# Patient Record
Sex: Male | Born: 1955 | Race: White | Hispanic: No | Marital: Married | Smoking: Current every day smoker
Health system: Southern US, Community
[De-identification: ages and names within clinical notes are randomized; demographics above are authoritative.]

## PROBLEM LIST (undated history)

## (undated) DIAGNOSIS — R51 Headache: Secondary | ICD-10-CM

## (undated) DIAGNOSIS — G8929 Other chronic pain: Secondary | ICD-10-CM

## (undated) DIAGNOSIS — G4730 Sleep apnea, unspecified: Secondary | ICD-10-CM

## (undated) DIAGNOSIS — R200 Anesthesia of skin: Secondary | ICD-10-CM

## (undated) DIAGNOSIS — F329 Major depressive disorder, single episode, unspecified: Secondary | ICD-10-CM

## (undated) DIAGNOSIS — M1990 Unspecified osteoarthritis, unspecified site: Secondary | ICD-10-CM

## (undated) DIAGNOSIS — R202 Paresthesia of skin: Secondary | ICD-10-CM

## (undated) DIAGNOSIS — Z8669 Personal history of other diseases of the nervous system and sense organs: Secondary | ICD-10-CM

## (undated) DIAGNOSIS — K219 Gastro-esophageal reflux disease without esophagitis: Secondary | ICD-10-CM

## (undated) DIAGNOSIS — R42 Dizziness and giddiness: Secondary | ICD-10-CM

## (undated) DIAGNOSIS — F32A Depression, unspecified: Secondary | ICD-10-CM

## (undated) DIAGNOSIS — R519 Headache, unspecified: Secondary | ICD-10-CM

## (undated) DIAGNOSIS — Z8709 Personal history of other diseases of the respiratory system: Secondary | ICD-10-CM

## (undated) DIAGNOSIS — M549 Dorsalgia, unspecified: Secondary | ICD-10-CM

## (undated) DIAGNOSIS — I10 Essential (primary) hypertension: Secondary | ICD-10-CM

## (undated) DIAGNOSIS — D649 Anemia, unspecified: Secondary | ICD-10-CM

## (undated) HISTORY — PX: TONSILLECTOMY: SUR1361

## (undated) HISTORY — PX: COLONOSCOPY: SHX174

## (undated) HISTORY — PX: HAMMER TOE SURGERY: SHX385

## (undated) HISTORY — PX: WRIST ARTHROPLASTY: SHX1088

## (undated) HISTORY — PX: OTHER SURGICAL HISTORY: SHX169

## (undated) HISTORY — PX: JOINT REPLACEMENT: SHX530

## (undated) HISTORY — PX: PAIN PUMP IMPLANTATION: SHX330

## (undated) HISTORY — PX: KNEE ARTHROSCOPY: SUR90

## (undated) HISTORY — PX: CHOLECYSTECTOMY: SHX55

## (undated) HISTORY — PX: UVULOPALATOPHARYNGOPLASTY: SHX827

## (undated) HISTORY — PX: APPENDECTOMY: SHX54

## (undated) HISTORY — PX: MUSCLE FLAP CLOSURE: SHX2054

---

## 1998-05-16 ENCOUNTER — Ambulatory Visit (HOSPITAL_BASED_OUTPATIENT_CLINIC_OR_DEPARTMENT_OTHER): Admission: RE | Admit: 1998-05-16 | Discharge: 1998-05-16 | Payer: Self-pay | Admitting: Orthopedic Surgery

## 1998-11-28 ENCOUNTER — Ambulatory Visit (HOSPITAL_BASED_OUTPATIENT_CLINIC_OR_DEPARTMENT_OTHER): Admission: RE | Admit: 1998-11-28 | Discharge: 1998-11-28 | Payer: Self-pay | Admitting: Orthopedic Surgery

## 1999-09-04 ENCOUNTER — Ambulatory Visit (HOSPITAL_BASED_OUTPATIENT_CLINIC_OR_DEPARTMENT_OTHER): Admission: RE | Admit: 1999-09-04 | Discharge: 1999-09-04 | Payer: Self-pay | Admitting: Orthopedic Surgery

## 2000-01-02 ENCOUNTER — Encounter: Payer: Self-pay | Admitting: Orthopedic Surgery

## 2000-01-08 ENCOUNTER — Inpatient Hospital Stay (HOSPITAL_COMMUNITY): Admission: RE | Admit: 2000-01-08 | Discharge: 2000-01-11 | Payer: Self-pay | Admitting: Orthopedic Surgery

## 2000-05-14 ENCOUNTER — Encounter: Admission: RE | Admit: 2000-05-14 | Discharge: 2000-08-12 | Payer: Self-pay | Admitting: Anesthesiology

## 2001-04-01 ENCOUNTER — Encounter: Payer: Self-pay | Admitting: Orthopedic Surgery

## 2001-04-05 ENCOUNTER — Inpatient Hospital Stay (HOSPITAL_COMMUNITY): Admission: RE | Admit: 2001-04-05 | Discharge: 2001-04-07 | Payer: Self-pay | Admitting: Orthopedic Surgery

## 2002-04-18 ENCOUNTER — Ambulatory Visit (HOSPITAL_BASED_OUTPATIENT_CLINIC_OR_DEPARTMENT_OTHER): Admission: RE | Admit: 2002-04-18 | Discharge: 2002-04-18 | Payer: Self-pay | Admitting: Orthopedic Surgery

## 2003-01-10 ENCOUNTER — Encounter: Payer: Self-pay | Admitting: Neurosurgery

## 2003-01-10 ENCOUNTER — Ambulatory Visit (HOSPITAL_COMMUNITY): Admission: RE | Admit: 2003-01-10 | Discharge: 2003-01-11 | Payer: Self-pay | Admitting: Neurosurgery

## 2005-05-06 ENCOUNTER — Ambulatory Visit (HOSPITAL_COMMUNITY): Admission: RE | Admit: 2005-05-06 | Discharge: 2005-05-07 | Payer: Self-pay | Admitting: Neurosurgery

## 2005-11-11 ENCOUNTER — Ambulatory Visit (HOSPITAL_COMMUNITY): Admission: RE | Admit: 2005-11-11 | Discharge: 2005-11-12 | Payer: Self-pay | Admitting: Orthopedic Surgery

## 2006-05-26 ENCOUNTER — Inpatient Hospital Stay (HOSPITAL_COMMUNITY): Admission: RE | Admit: 2006-05-26 | Discharge: 2006-05-28 | Payer: Self-pay | Admitting: Orthopedic Surgery

## 2006-11-13 ENCOUNTER — Ambulatory Visit (HOSPITAL_COMMUNITY): Admission: RE | Admit: 2006-11-13 | Discharge: 2006-11-13 | Payer: Self-pay | Admitting: Orthopedic Surgery

## 2006-11-13 ENCOUNTER — Ambulatory Visit: Payer: Self-pay | Admitting: Vascular Surgery

## 2006-11-19 ENCOUNTER — Ambulatory Visit: Payer: Self-pay | Admitting: Internal Medicine

## 2006-11-19 DIAGNOSIS — L03119 Cellulitis of unspecified part of limb: Secondary | ICD-10-CM

## 2006-11-19 DIAGNOSIS — L02419 Cutaneous abscess of limb, unspecified: Secondary | ICD-10-CM | POA: Insufficient documentation

## 2006-11-19 DIAGNOSIS — Z96659 Presence of unspecified artificial knee joint: Secondary | ICD-10-CM

## 2006-11-19 DIAGNOSIS — I10 Essential (primary) hypertension: Secondary | ICD-10-CM | POA: Insufficient documentation

## 2006-11-19 DIAGNOSIS — M1990 Unspecified osteoarthritis, unspecified site: Secondary | ICD-10-CM | POA: Insufficient documentation

## 2006-11-23 ENCOUNTER — Other Ambulatory Visit: Payer: Self-pay | Admitting: Orthopedic Surgery

## 2006-11-24 ENCOUNTER — Other Ambulatory Visit: Payer: Self-pay | Admitting: Orthopedic Surgery

## 2006-11-25 ENCOUNTER — Telehealth: Payer: Self-pay | Admitting: Internal Medicine

## 2006-11-26 ENCOUNTER — Ambulatory Visit: Payer: Self-pay | Admitting: Infectious Diseases

## 2006-11-26 ENCOUNTER — Encounter (INDEPENDENT_AMBULATORY_CARE_PROVIDER_SITE_OTHER): Payer: Self-pay | Admitting: Orthopedic Surgery

## 2006-11-26 ENCOUNTER — Ambulatory Visit (HOSPITAL_COMMUNITY): Admission: RE | Admit: 2006-11-26 | Discharge: 2006-11-27 | Payer: Self-pay | Admitting: Orthopedic Surgery

## 2007-01-28 ENCOUNTER — Ambulatory Visit (HOSPITAL_COMMUNITY): Admission: RE | Admit: 2007-01-28 | Discharge: 2007-01-29 | Payer: Self-pay | Admitting: Orthopedic Surgery

## 2007-02-09 ENCOUNTER — Inpatient Hospital Stay (HOSPITAL_COMMUNITY): Admission: AD | Admit: 2007-02-09 | Discharge: 2007-02-11 | Payer: Self-pay | Admitting: Orthopedic Surgery

## 2007-09-07 ENCOUNTER — Ambulatory Visit (HOSPITAL_COMMUNITY): Admission: RE | Admit: 2007-09-07 | Discharge: 2007-09-07 | Payer: Self-pay | Admitting: Orthopedic Surgery

## 2008-02-21 ENCOUNTER — Encounter: Payer: Self-pay | Admitting: Infectious Disease

## 2008-05-22 ENCOUNTER — Ambulatory Visit: Payer: Self-pay | Admitting: Infectious Disease

## 2008-05-22 DIAGNOSIS — M86669 Other chronic osteomyelitis, unspecified tibia and fibula: Secondary | ICD-10-CM | POA: Insufficient documentation

## 2008-05-22 LAB — CONVERTED CEMR LAB
AST: 26 units/L (ref 0–37)
Albumin: 4 g/dL (ref 3.5–5.2)
Alkaline Phosphatase: 77 units/L (ref 39–117)
CRP: 1.3 mg/dL — ABNORMAL HIGH (ref <0.6)
Calcium: 8.9 mg/dL (ref 8.4–10.5)
Chloride: 102 meq/L (ref 96–112)
Creatinine, Ser: 0.68 mg/dL (ref 0.40–1.50)
Hemoglobin: 13.2 g/dL (ref 13.0–17.0)
Lymphocytes Relative: 30 % (ref 12–46)
Lymphs Abs: 2.2 10*3/uL (ref 0.7–4.0)
MCV: 85 fL (ref 78.0–100.0)
Monocytes Relative: 7 % (ref 3–12)
Neutrophils Relative %: 59 % (ref 43–77)
Platelets: 192 10*3/uL (ref 150–400)
RBC: 4.86 M/uL (ref 4.22–5.81)
RDW: 16 % — ABNORMAL HIGH (ref 11.5–15.5)
Sodium: 139 meq/L (ref 135–145)
Total Bilirubin: 0.3 mg/dL (ref 0.3–1.2)
Total Protein: 6.8 g/dL (ref 6.0–8.3)
WBC: 7.3 10*3/uL (ref 4.0–10.5)

## 2008-05-24 ENCOUNTER — Ambulatory Visit (HOSPITAL_COMMUNITY): Admission: RE | Admit: 2008-05-24 | Discharge: 2008-05-24 | Payer: Self-pay | Admitting: Infectious Disease

## 2008-06-12 ENCOUNTER — Encounter: Payer: Self-pay | Admitting: Internal Medicine

## 2008-06-12 ENCOUNTER — Ambulatory Visit: Payer: Self-pay | Admitting: Infectious Disease

## 2008-07-10 ENCOUNTER — Ambulatory Visit: Payer: Self-pay | Admitting: Infectious Disease

## 2008-07-10 LAB — CONVERTED CEMR LAB
BUN: 14 mg/dL (ref 6–23)
CO2: 23 meq/L (ref 19–32)
Calcium: 8.9 mg/dL (ref 8.4–10.5)
Creatinine, Ser: 0.84 mg/dL (ref 0.40–1.50)
Eosinophils Absolute: 0.2 10*3/uL (ref 0.0–0.7)
Glucose, Bld: 99 mg/dL (ref 70–99)
Lymphocytes Relative: 31 % (ref 12–46)
MCV: 87.1 fL (ref 78.0–100.0)
Monocytes Relative: 9 % (ref 3–12)
Neutro Abs: 4.3 10*3/uL (ref 1.7–7.7)
Platelets: 176 10*3/uL (ref 150–400)
Potassium: 3.7 meq/L (ref 3.5–5.3)
RDW: 15.1 % (ref 11.5–15.5)
Sed Rate: 22 mm/hr — ABNORMAL HIGH (ref 0–16)

## 2008-07-26 ENCOUNTER — Ambulatory Visit: Payer: Self-pay | Admitting: Infectious Disease

## 2008-07-27 ENCOUNTER — Encounter: Payer: Self-pay | Admitting: Infectious Disease

## 2008-10-03 ENCOUNTER — Inpatient Hospital Stay (HOSPITAL_COMMUNITY): Admission: RE | Admit: 2008-10-03 | Discharge: 2008-10-06 | Payer: Self-pay | Admitting: Orthopedic Surgery

## 2008-10-04 ENCOUNTER — Ambulatory Visit: Payer: Self-pay | Admitting: Infectious Diseases

## 2010-08-27 LAB — BODY FLUID CULTURE: Culture: NO GROWTH

## 2010-08-27 LAB — DIFFERENTIAL
Basophils Absolute: 0 10*3/uL (ref 0.0–0.1)
Basophils Relative: 0 % (ref 0–1)
Eosinophils Absolute: 0.2 10*3/uL (ref 0.0–0.7)
Monocytes Absolute: 0.5 10*3/uL (ref 0.1–1.0)
Neutro Abs: 4 10*3/uL (ref 1.7–7.7)

## 2010-08-27 LAB — GRAM STAIN

## 2010-08-27 LAB — CBC
HCT: 30.9 % — ABNORMAL LOW (ref 39.0–52.0)
HCT: 33.3 % — ABNORMAL LOW (ref 39.0–52.0)
Hemoglobin: 11.4 g/dL — ABNORMAL LOW (ref 13.0–17.0)
Hemoglobin: 9.3 g/dL — ABNORMAL LOW (ref 13.0–17.0)
Hemoglobin: 13.9 g/dL (ref 13.0–17.0)
MCHC: 34.3 g/dL (ref 30.0–36.0)
MCHC: 34.7 g/dL (ref 30.0–36.0)
MCV: 88.7 fL (ref 78.0–100.0)
MCV: 88.9 fL (ref 78.0–100.0)
MCV: 88 fL (ref 78.0–100.0)
Platelets: 138 10*3/uL — ABNORMAL LOW (ref 150–400)
Platelets: 143 10*3/uL — ABNORMAL LOW (ref 150–400)
RBC: 3.48 MIL/uL — ABNORMAL LOW (ref 4.22–5.81)
RBC: 4.54 MIL/uL (ref 4.22–5.81)
RDW: 15.1 % (ref 11.5–15.5)
RDW: 15.4 % (ref 11.5–15.5)
WBC: 10.1 10*3/uL (ref 4.0–10.5)
WBC: 12.2 10*3/uL — ABNORMAL HIGH (ref 4.0–10.5)

## 2010-08-27 LAB — URINE CULTURE
Colony Count: NO GROWTH
Culture: NO GROWTH

## 2010-08-27 LAB — COMPREHENSIVE METABOLIC PANEL
Albumin: 3.6 g/dL (ref 3.5–5.2)
Alkaline Phosphatase: 73 U/L (ref 39–117)
BUN: 11 mg/dL (ref 6–23)
Chloride: 104 mEq/L (ref 96–112)
Glucose, Bld: 116 mg/dL — ABNORMAL HIGH (ref 70–99)
Potassium: 3.9 mEq/L (ref 3.5–5.1)
Total Bilirubin: 0.4 mg/dL (ref 0.3–1.2)

## 2010-08-27 LAB — APTT: aPTT: 31 seconds (ref 24–37)

## 2010-08-27 LAB — ANAEROBIC CULTURE

## 2010-08-27 LAB — BASIC METABOLIC PANEL
Calcium: 7.5 mg/dL — ABNORMAL LOW (ref 8.4–10.5)
Calcium: 7.9 mg/dL — ABNORMAL LOW (ref 8.4–10.5)
Chloride: 95 mEq/L — ABNORMAL LOW (ref 96–112)
Chloride: 99 mEq/L (ref 96–112)
Creatinine, Ser: 0.62 mg/dL (ref 0.4–1.5)
Creatinine, Ser: 0.72 mg/dL (ref 0.4–1.5)
GFR calc Af Amer: >60 mL/min (ref >60)
GFR calc non Af Amer: >60 mL/min (ref >60)
Glucose, Bld: 118 mg/dL — ABNORMAL HIGH (ref 70–99)
Glucose, Bld: 151 mg/dL — ABNORMAL HIGH (ref 70–99)
Glucose, Bld: 167 mg/dL — ABNORMAL HIGH (ref 70–99)
Potassium: 3.6 mEq/L (ref 3.5–5.1)
Sodium: 136 mEq/L (ref 135–145)

## 2010-08-27 LAB — URINALYSIS, ROUTINE W REFLEX MICROSCOPIC
Bilirubin Urine: NEGATIVE
Glucose, UA: NEGATIVE mg/dL
Ketones, ur: NEGATIVE mg/dL
Nitrite: NEGATIVE
pH: 6.5 (ref 5.0–8.0)

## 2010-08-27 LAB — URINE MICROSCOPIC-ADD ON

## 2010-10-01 NOTE — Op Note (Signed)
NAMEJADORE, VEALS             ACCOUNT NO.:  [id number]   MEDICAL RECORD NO.:  [id number]          PATIENT TYPE:  INP   LOCATION:  5038                         FACILITY:  [facility]   PHYSICIAN:  Rodney A. Mortenson, M.D.DATE OF BIRTH:  September 14, 1955   DATE OF PROCEDURE:  10/03/2008  DATE OF DISCHARGE:                               OPERATIVE REPORT   PREOPERATIVE DIAGNOSES:  End-stage osteoarthritis; degenerative joint  disease, left knee.   POSTOPERATIVE DIAGNOSES:  End-stage osteoarthritis; degenerative joint  disease, left knee; status post osteotomy of the left proximal tibia;  history of abscess formation; status post gastroc flap.   PROCEDURE:  Left total knee using DePuy components with a PFC Sigma  components.  All components are listed in the progress notes.   SURGEON:  Lenard Galloway. Chaney Malling, MD   ASSISTANTS:  Cleophas Dunker and Su Hilt.   ANESTHESIA:  General.   PROCEDURE IN DETAIL:  The patient was placed on the operating table in  supine position.  Pneumatic tourniquet to the left upper thigh.  The  entire left lower extremity was prepped with DuraPrep and draped in  usual manner.  The anterior aspect of the knee was covered with a large  Vi-Drape.  The leg was then wrapped out with an Esmarch and tourniquet  was elevated.  The incision was made starting below the tibial tubercle  and carried proximal to the patella.  Skin edges were retracted.  There  was a large medial gastroc flap on the medial side and this was  protected throughout the procedure, and vascular access was posterior to  the flap and I stayed anterior and far way from the vascular supply of  the flap.  The patient had a previous osteotomy in this area and with  great deal of scar tissue.  Once the wound was opened, bleeders were  coagulated along and the medial parapatellar incision was made.  There  was significant varus deformity to the knee and a large capsular flap of  the medial side of the knee was  made.  The patella was everted and held  everted.  Knee was flexed to 90 degrees.  A partial synovectomy was  done.  Schanz pins were placed proximally first distally in the femur  and proximally in the tibia in standard manner, and the tibial and  femoral rays were made.  The medial and lateral menisci were excised.  The cruciate ligaments were excised.  There was a great deal of the  deformity about the knee.  Then, we started registration defining the  mechanical axis of the femoral head at the center of the tibial  mechanical axis defining the proximal tibial plateau and defining the  femoral condyles and __________ all following computer promptings.  Once  this was done, it was registered and tibial planning was then done.  Two  resection settings were registered and tibial resection was done  following computer promptings.  Again, there was marked deformity of the  proximal tibia secondary to previous osteotomy site, which was in varus  and proximal part of the tibia selected posteriorly.  Soft tissue  balancing  was done with inserts.  Femoral implant planning was then  done.  Using the suggestions for the femur, the distal end of the femur  was went through 3 steps cutting following all the computer promptings.  The femoral component was then put in place.  The tibia was then  subluxed anteriorly and a size 5 tibial tray seemed to fit nicely and  this was locked in position.  The smokestack was put in position and  drill hole was placed in proximal tibia.  The wing broaches passed down  and stabilized.  This was drilled fairly deeply as we were going to use  a revision tray to pass the osteotomy site.  This was set in position  and locked in position and then a trial tibial poly was put in place.  The femoral component was then positioned over the distal femur and knee  was articulated.  There was excellent stability to varus and valgus  stress and with flexion, poly popped out and  spun out on the lateral  side.  Further loosening of the medial side of the soft tissues was  done.  Once this was completed, this flexed better but tend to sublux  anteriorly.  There was good extension, good stability in extension on  full flexion and tibia was subluxed anteriorly and various sized polys  were inserted, but this did not seem to solve the problem adequately in  my view.  It was then selected to use the Sigma Knee System to stabilize  the tibial tray in the distal femur with a box.  Using the Sigma System,  distal end of the femur was re-cut, and the femoral trial was put in  position.  The 17-mm Sigma RPTC III insert seated nicely.  The knee  could be put through a full range of motion.  It was very stable in full  flexion, full extension.  No instability to varus and valgus stressing.  I was very pleased with the final construct.  All the trial components  removed.  Pulsating lavage was used.  Actually, it was a very  technically difficult operation and took an extended period of time,  though proceeded very nicely.  After all the trials removed, glue was  mixed and this was mixed with tobramycin in the standard manner.  All  components were then reinserted in the knee in the standard manner.  Excess glue was removed.  I initially put in place with a trial poly.  Excess glue was removed.  After glue had cured, the trial poly was  removed, any excess glue was removed with a small osteotome and forceps.  Fair amount of time was spent cleaning up the knee.  Pulsating lavage  was used.  We did have an extended tourniquet time and the first  tourniquet was 140 minutes.  The tourniquet was dropped for 15 minutes  and then reinserted for 93 minutes.  Once we had dropped the tourniquet  and all bleeders were controlled very nicely.  No arterial pumpers were  seen.  The final TC III insert was snapped fit in place, and the knee  was very stable and went through good range of  motion.  There was good  tracking of patella.  No subluxation.  FloSeal was inserted about the  knee.  The long medial parapatellar incision was then closed with  interrupted heavy Ethibond sutures.  Heavy Vicryl was used to close  subcutaneous tissue and stainless steel staples were used to  close the  skin.  I was very pleased with the final outcome.  Sterile dressings and  a pressure dressing was applied, and the patient returned to the  recovery room in excellent condition.  Again, this was an extended  operative procedure.  It took about twice time for a normal total knee  replacement and was very complicated, but nonetheless ended up extremely  well and was very stable with good range of motion.  The patient then  returned to recovery room in excellent condition.   DRAINS:  None.   COMPLICATIONS:  None.      Rodney A. Chaney Malling, M.D.  Electronically Signed     RAM/MEDQ  D:  10/03/2008  T:  10/04/2008  Job:  [id number]

## 2010-10-01 NOTE — Op Note (Signed)
[name], [name]             ACCOUNT NO.:  [id number]   MEDICAL RECORD NO.:  [id number]          PATIENT TYPE:  INP   LOCATION:  5034                         FACILITY:  [facility]   PHYSICIAN:  Thereasa Distance A. Mortenson, M.D.DATE OF BIRTH:  04-Nov-1955   DATE OF PROCEDURE:  02/09/2007  DATE OF DISCHARGE:                               OPERATIVE REPORT   ADDENDUM:  Operative note was dictated earlier.  An additional procedure  was done.   PREOPERATIVE DIAGNOSIS:  Seroma, left iliac donor site crest.   POSTOPERATIVE DIAGNOSIS:  Seroma, left iliac donor site crest.   OPERATION:  Drain seroma, left iliac crest, irrigate and closure.   SURGEON:  Lenard Galloway. Chaney Malling, M.D.   ASSISTANT:  Arlys John D. Petrarca, P.A.-C.   ANESTHESIA:  General.   PROCEDURE:  The patient had I&D of proximal tibia dictated on an earlier  operative note today.  There was some serous drainage from a seroma of  the donor site in the left iliac crest and it was felt that this should  be drained. The area was prepped with DuraPrep and draped out in the  usual manner.  The wound was opened up and there was seroma in this  area.  The opened and a finger was inserted.  Two small subcutaneous  stitches were removed.  The pulsating lavage was then used and the wound  was cleaned nicely.  There was no large pockets of blood or any other  problems.  This was fairly superficial.  Three 2-0 nylon tension sutures  were put in place to close to the wound.  Sterile dressings were applied  and the patient returned to the recovery room in excellent condition.  Technically, this went extremely well.           ______________________________  Lenard Galloway. Chaney Malling, M.D.     RAM/MEDQ  D:  02/09/2007  T:  02/10/2007  Job:  [id number]

## 2010-10-01 NOTE — Op Note (Signed)
[name], [name]             ACCOUNT NO.:  [id number]   MEDICAL RECORD NO.:  [id number]          PATIENT TYPE:  OIB   LOCATION:  5011                         FACILITY:  [facility]   PHYSICIAN:  Rodney A. Mortenson, M.D.DATE OF BIRTH:  03-12-1956   DATE OF PROCEDURE:  01/28/2007  DATE OF DISCHARGE:                               OPERATIVE REPORT   PREOPERATIVE DIAGNOSIS:  Infected osteotomy site, left proximal tibia.   POSTOPERATIVE DIAGNOSIS:  Infected osteotomy site, left proximal tibia.   OPERATION:  Remove tobramycin impregnated methyl methacrylate wedge  tibia; repair fracture proximal tibia open reduction and internal  fixation using compression plate; harvest autogenous bone left iliac  crest; bone graft to the osteotomy site.   SURGEON:  Lenard Galloway. Chaney Malling, M.D.   ASSISTANT:  Arlys John D. Petrarca, P.A.-C.   ANESTHESIA:  General.   PROCEDURE:  The patient was placed on the operating table in a supine  position.  The entire left lower extremity and the iliac crest were  prepped out into the field.  An incision was made over the iliac crest  anteriorly.  The skin edges were retracted.  Bleeders were coagulated.  Dissection was carried down to the iliac crest, itself.  The origin of  the hip flexors on the upper part of the crest were released  subperiosteally and stripped down.  Large Cobbs were used.  A portion of  iliac crest was then completely exposed.  A great deal of time was spent  doing this part of the operative procedure.  An osteotome was then used  to cut out a cortical window and a series of gouges were used to strip  the underlying cancellus into large strips.  A great deal of time was  spent harvesting bone.  This was all placed in the separate sterile  basin on the back table.  A large amount of bone graft was harvested.  This took about an hour to complete this part of the task.  Bleeding was  controlled very nicely.  Large pads were placed in the wound and  packed  tightly and attention turned to the tibia.   Attention turned to the tibia.  A sterile fashion tourniquet was placed  above the knee.  The lower extremity was then wrapped out in an Esmarch  and the tourniquet was elevated.  An incision was made over medial side  of the distal femur proximal tibia through the previous osteotomy site.  Bleeders were coagulated.  There was a fair amount of scar in this area.  Dissection was carried down to the medial border of the medial femoral  condyle and the medial side of the capsular area of the proximal tibia.  Bleeding was controlled throughout.  The large wedge of tobramycin  impregnated methyl methacrylate was found.  A valgus stress was applied  and two knee osteotomes were used to break the wedge free. Once the  wedge was free, it was totally extracted.  There was fibrous tissue in  the osteotomy site.  No evidence of infection or fluid was seen, no  inflammation of the adjacent tissues.  This  looked quite stable and  infection free.  Cultures were not taken.  A series of curets were then  used to scrape off all the fibrous tissue at the ostomy site.  This was  done meticulously and a great deal of time was spent accomplishing this  to my satisfaction.  Pulsating lavage was used throughout.  The debris  was removed.  The lateral border of the proximal tibia had the  appropriate several levels so that the osteotomy site could be closed  down into more varus.  This was accomplished very nicely.  This was all  checked with radiographs and C-arm throughout the operative procedure.   A long medial buttress plate had been selected.  This was placed over  the proximal tibial section in position with the radiographs.  Long  locking screws that had been placed in the proximal tibial were  sectioned.  With the leg brought into varus, it closed down the  osteotomy site very nicely and butted up against the plate distally.  I  was very pleased  with the realignment.  The bone graft, which had been  harvested earlier, was then brought to the ostomy site and packed in.  A  large amount of bone graft was harvested as noted above and this  completely covered both sides of the ostomy site.  The large cavity  defect was completely field.  The leg was then brought into the varus  and a compression device was placed distally on the distal part of the  buttress plate and closed down.  Radiographs were taken and showed the  ostomy site to be completely closed and good bone graft in the osteotomy  site.  The leg was very stable and could be moved with no toggling, at  all.   Three drill holes were then placed in the distal plate and the  appropriate length screws were inserted for excellent fixation.  The  compression device was removed.  X-rays were taken.  I was extremely  pleased with the realignment and the fixation was achieved.  There was  good range of motion about the knee.  No instability whatsoever.  The  subcutaneous tissue was closed with the Vicryl. Retention sutures were  used to close the skin and stainless steel staples.  Proximally, the  donor site over the iliac crest was dried and a large Gelfoam pad was  put in and the proximal muscles were reattached to the iliac crest using  heavy Vicryl.  The subcutaneous tissues were quite thick and were closed  with Vicryl and the skin was closed with stainless steel staples.  No  drains were placed in the crest and no drains were placed in the knee.  Sterile dressings were applied to the iliac crest and a pressure  dressing to the knee.  The patient returned to the recovery room in  excellent condition.  Technically, I was extremely pleased with the  surgical reconstruction.  All objectives were met very nicely.  Complications none.           ______________________________  Lenard Galloway. Chaney Malling, M.D.     RAM/MEDQ  D:  01/28/2007  T:  01/28/2007  Job:  [id number]

## 2010-10-01 NOTE — Op Note (Signed)
[name], [name]             ACCOUNT NO.:  [id number]   MEDICAL RECORD NO.:  [id number]          PATIENT TYPE:  INP   LOCATION:  5034                         FACILITY:  [facility]   PHYSICIAN:  Thereasa Distance A. 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   OPERATION:  Incision and drainage and wound vac application to the large  wound proximal medial metaphyseal area, left tibia.   SURGEON:  Lenard Galloway. Chaney Malling, M.D.   ASSISTANT:  Arlys John D. Petrarca, P.A.-C.   ANESTHESIA:  General.   PROCEDURE:  The patient placed on the operating table in supine  position.  Pneumatic tourniquet placed about the left upper thigh.  After satisfactory general anesthesia, left lower extremity prepped with  DuraPrep, draped out in the usual manner.  All stitches were removed.  The wounds were opened bluntly.  Subcutaneous tissue all removed.  There  was some serous drainage, no obvious pockets of pus or purulent material  was seen.  Dissection was carried down deeply.  There is good  granulation tissue.  The pulsating lavage was then used and the wound  was irrigated with copious amounts saline solution.  After complete  debridement, irrigation, the wound looked very nice.  Wound vac sponge  was cut and formed and placed in the wound.  The wound measured 8 inches  x 4 inches x 3 inches.  The sponge was placed in the wound in  appropriate layer sheets and vacuum device was placed over the sponge in  the standard manner.  The vacuum was applied and this was stable.  There  was good suction and the wound closed down very nice.  There was good  vacuum on the wound.  Wound closed down to half its size under suction.  The wound was fairly dry prior to applying the wound vac device.  Sterile dressings were applied and the patient returned recovery room in  nice condition.  Technically I was extremely pleased with the findings  and the final outcome.           ______________________________  Lenard Galloway. Chaney Malling, M.D.     RAM/MEDQ  D:  02/09/2007  T:  02/10/2007  Job:  [id number]

## 2010-10-01 NOTE — Op Note (Signed)
NAMETARIQ, PERNELL             ACCOUNT NO.:  [id number]   MEDICAL RECORD NO.:  [id number]          PATIENT TYPE:  INP   LOCATION:  5038                         FACILITY:  [facility]   PHYSICIAN:  Valetta Fuller, M.D.  DATE OF BIRTH:  05-08-1956   DATE OF PROCEDURE:  10/03/2008  DATE OF DISCHARGE:                               OPERATIVE REPORT   PREOPERATIVE DIAGNOSIS:  Inability to place Foley catheter.   POSTOPERATIVE DIAGNOSIS:  Inability to place Foley catheter.   PROCEDURE PERFORMED:  Flexible cystoscopy with placement of coude Foley  catheter.   INDICATIONS:  Mr. Lipson is 55 years of age.  He is undergoing an  orthopedic procedure.  There is no known urologic history.  Several  attempts were made to place catheters at the beginning of his case,  which were unsuccessful.  The surgery was started and we were contacted  later with the request to help place a Foley catheter at the completion  of the procedure.  The Orthopedic procedure had completed, which will be  dictated by Dr. Chaney Malling.  We prepped and draped the penis in a normal  manner.  I felt that it would be prudent to visually locate the  situation since multiple attempts at catheter placement were undertaken.  I found his anterior urethra to be unremarkable without evidence of  stricture disease.  The patient had a relatively small prostatic urethra  with minimal lateral lobe tissue.  He did have a somewhat high-riding  median bar.  I could see no other evidence of visual obstruction, no  other abnormalities were present.  The cystoscope was removed and a 31-  [place] coude catheter was placed without difficulty.  A large amount of  clear urine was obtained.      Valetta Fuller, M.D.  Electronically Signed     DSG/MEDQ  D:  10/03/2008  T:  10/04/2008  Job:  [id number]   cc:   Thereasa Distance A. Chaney Malling, M.D.

## 2010-10-01 NOTE — Op Note (Signed)
[name], [name]             ACCOUNT NO.:  [id number]   MEDICAL RECORD NO.:  [id number]          PATIENT TYPE:  OIB   LOCATION:  5004                         FACILITY:  [facility]   PHYSICIAN:  Rodney A. 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   OPERATION:  1. Incision and drainage of abscess.  2. Removal hardware.  3. Removal of nonunion bone grafts to osteotomy site.  4. Insertion of antibiotic spacer left leg.   SURGEON:  Lenard Galloway. Chaney Malling, M.D.   ASSISTANT:  Legrand Pitts. Duffy, P.A.   ANESTHESIA:  General.   PROCEDURE:  The patient was placed on the operating table in the supine  position with a pneumatic tourniquet about the left upper thigh.  The  entire left lower extremity was prepped with DuraPrep, and draped out in  the usual manner.  The leg was wrapped out in Esmarch, The tourniquet  was elevated.  Incision made over the medial side of the proximal tibia  in the metaphyseal area, at the site of the previous osteotomy and  fixation site.   There were draining sinuses in this area.  Incision made through the  previous incision.  Self-retaining retractor was put in place.  Draining  sinuses were seen.  Cultures were sent for aerobic and anaerobic  cultures.  Dissection was carried down to the metaphyseal portal of te  proximal tibia.  There was a large pudo plate in place and this was  stable.  The pudo plate was removed.  Bone in this area was then debrided with  rongeur and sent for microscopic examination H&E, rule out  osteomyelitis.  Additional cultures were taken deep in the wound.   With a valgus stress, the previous bone grafts were inspected.  Initially  I felt them to be stable, but on further manipulation both of the wedge  shaped bone grafts were loose.  These were then removed using rongeurs  an osteotome and curettes.  All the synthetic bone that had been put in  the ostomy site was then removed very carefully.  Once this accomplished  to my satisfaction, after a great deal of time and patience a pulsating  lavage was used.  A large volume of pulsating saline was used to clean  out the wound.  Additional debridement was done.  At this point methyl  methacrylate was mixed with 2.4 g of tobramycin was done on the back  table.   Once this started to set up with the leg in valgus and the osteotomy  site open; the methyl methacrylate spacer was inserted; and this  preserved the valgus alignment.  This was held in position until it was  secured.  A great deal of time was spent working on this leg and  cleaning out any tissue did not appear viable.  A large,  self-retaining  retention suture was put into place and staples used to close the skin.  A large bulky pressure dressing was applied and a postoperative hinged  knee brace with foot piece was applied in the operating room.  The  patient returned to the recovery room in excellent condition.  Technically this went extremely well.   DISPOSITION:  1. Infectious disease will advise [place] as to the proper antibiotic      treatment based on previous cultures and the cultures.  2. Touchdown weightbearing with a walker and a brace on the leg.           ______________________________  Lenard Galloway. Chaney Malling, M.D.     RAM/MEDQ  D:  11/26/2006  T:  11/27/2006  Job:  [id number]

## 2010-10-01 NOTE — Op Note (Signed)
[name], [name]             ACCOUNT NO.:  [id number]   MEDICAL RECORD NO.:  [id number]          PATIENT TYPE:  AMB   LOCATION:  SDS                          FACILITY:  [facility]   PHYSICIAN:  Rodney A. Mortenson, M.D.DATE OF BIRTH:  11/04/1955   DATE OF PROCEDURE:  09/07/2007  DATE OF DISCHARGE:  09/07/2007                               OPERATIVE REPORT   PREOPERATIVE DIAGNOSIS:  Painful hardware, left proximal tibia.   PREOPERATIVE DIAGNOSIS:  Painful hardware, left proximal tibia.   OPERATION:  Removal of hardware, medial metaphyseal area, left proximal  tibia, I and D, irrigation, curetting bone, and screw holes with  application of wound VAC.   SURGEON:  Lenard Galloway. Chaney Malling, MD   ASSISTANT:  Oris Drone. Petrarca, P.A.-C.   ANESTHESIA:  General.   PROCEDURE:  The patient was placed on the operating table in the supine  position.  Pneumatic tourniquet was placed on the left upper thigh.  The  entire left lower extremity was prepped with DuraPrep and draped down in  the usual manner.  The leg was wrapped with Esmarch; tourniquet was  elevated.  An incision was made through the previous incisions; it was  carried down to the metal plate.  There was a fair amount of scar tissue  in this area.  Several small draining sinuses were excised.  A wide  portion of the scar tissue was excised over the plate.  A periosteal  elevator was then used, and the soft tissue were stripped off the plate.  Each of screws were then removed, and the plate was removed.  Aerobic  and anaerobic cultures taken of fluid in this area and also some soft  tissue.  A rongeur was used to debride the screw holes and all adjacent  area.  Small curettes were used to curette out each screw hole.  Pulsating lavage was used extensively and irrigated.  All scar tissue  which appeared to be avascular was debrided and stripped out.  A curette  was then used to clean off the surface of the bone and the drill holes  again.  This was followed by a large rongeurs.  Excellent decompression  of the entire area, and small sinus tracts were excised.  Throughout the  procedure, pulsating lavage was used.  Once this was decompressed to my  satisfaction, the wound VAC was selected.  The wound VAC sponge was cut  to appropriate size.  Wound measured at least 15 by 8 to 10 cm by 3 cm.  The sponge was put in place, and the wound VAC was attached in an  appropriate manner.  An excellent airtight seal was achieved, and once  wound VAC was activated, the dressing sucked down.  The wound edges  partially came together.  Sterile dressings were applied, and the  patient was returned to the recovery room in an excellent condition.  Technically, the procedure went extremely well.   DRAINS:  Wound VAC.   COMPLICATIONS:  None.      Rodney A. Chaney Malling, M.D.  Electronically Signed     RAM/MEDQ  D:  09/07/2007  T:  09/08/2007  Job:  [id number]

## 2010-10-04 NOTE — Op Note (Signed)
[name], PICKUP             ACCOUNT NO.:  [id number]   MEDICAL RECORD NO.:  [id number]          PATIENT TYPE:  AMB   LOCATION:  SDS                          FACILITY:  [facility]   PHYSICIAN:  Rodney A. Mortenson, M.D.DATE OF BIRTH:  [birth year]   DATE OF PROCEDURE:  11/11/2005  DATE OF DISCHARGE:                                 OPERATIVE REPORT   JUSTIFICATION:  A 55 year old male on August 25, 2005, stepped off the back of  a bread truck and twisted his left knee.  He had sudden pain along the  medial joint line.  He now has trouble with completely extending the knee  with pain along the medial joint line.  There is a 2+ effusion with acute  tenderness over the medial joint line.  No lateral joint line tenderness.  Unable to get MRI because the patient does have a stimulator.  Because of  persistent pain and no resolution, it is felt that diagnostic arthroscopy  was indicated and necessary.  Questions were answered and encouraged  preoperatively.  Complications discussed.   PREOPERATIVE DIAGNOSIS:  Probable tear medial meniscus, left knee.   POSTOPERATIVE DIAGNOSIS:  Tear posterior horn of medial meniscus, left knee;  early osteoarthritis medial compartment, left knee.   OPERATION:  Arthroscopy, partial medial meniscectomy, left knee; debridement  medial femoral condyle, left knee.   SURGEON:  Lenard Galloway. Chaney Malling, M.D.   ANESTHESIA:  MAC.   PATHOLOGY:  With the arthroscope in the knee, very careful examination was  undertaken.  The patellofemoral joint appeared fairly normal.  In the  lateral compartment, I checked the lateral femoral condyle which was normal,  as well as the entire circumference of the lateral meniscus.  The anterior  cruciate ligament was normal.  In the medial compartment, some grade 2  cartilage damage over the medial side of the medial femoral condyle in the  weightbearing area and there was a complex tear of the posterior horn of the  medial  meniscus.   PROCEDURE:  The patient was placed on the operative table in supine  position.  Pneumatic tourniquet was placed on the left upper thigh.  The  left leg was placed in the leg holder and the entire left lower extremity  prepped with DuraPrep and draped out in the usual manner.  An infusion  cannula was placed in the superomedial pouch and the knee distended with  saline.  Anteromedial and anterolateral portals were made and the  arthroscope was introduced.  All the pathology was seen in the medial  compartment through both the medial and lateral portals.  A series of  baskets were inserted and the posterior one-third of the medial meniscus was  aggressively debrided.  This was followed up with the intake of shaver.  All  debris was removed.  The remaining rim was then smoothed and showed a nice  transition of that third of the medial meniscus.  A chrondro0plasty was done  of the medial femoral condyle.  Marcaine was then placed and a then a large  bulky pressure dressing applied.  The patient then returned to the recovery  room in excellent condition.  The patient tolerated this procedure extremely  well.   DISPOSITION:  1.  The patient is to stay overnight because he does have sleep apnea.  2.  Discharge in a.m.  3.  Return to my office on Monday.  4.  Percocet for pain.           ______________________________  Lenard Galloway. Chaney Malling, M.D.     RAM/MEDQ  D:  11/11/2005  T:  11/11/2005  Job:  [id number]

## 2010-10-04 NOTE — Discharge Summary (Signed)
. [facility]  Patient:    [name], [name] Visit Number: [id number] MRN: [id number]          Service Type: SUR Location: [id number] Attending Physician:  Milly Jakob Dictated by:   Currie Paris Thedore Mins. 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  PROCEDURE:  Exploration/arthrotomy right knee with extensive synovectomy right knee with revision of tibial and patella polyethylene components, right knee.  HISTORY OF PRESENT ILLNESS:  The patient is a 55 year old male who has a history of a right total knee arthroplasty which was done in August 2001.  He did well initially postoperatively but then began having pain with weightbearing in his right knee.  Plain x-rays of the right knee showed no evidence of gross loosening.  He did have a positive bone scan of the right knee consistent with possible loosening of the tibial component. He got no relief with exhaustive conservative treatment including attempts at weight reduction and was requiring narcotics for pain control.  He also underwent exhaustive conservative treatment including physical therapy and continued to have pain in his right knee.  He is admitted for right knee exploration and revision of his total knee components as indicated.  DIAGNOSTIC STUDIES:  EKG on admission showed normal sinus rhythm, no abnormalities.  Preoperative chest x-ray showed probable chronic obstructive pulmonary disease (chronic bronchitis no evidence of acute disease).  The patients hemoglobin on  admission was 13.7, hematocrit 39.6, and WBC 5.6. Hemoglobin on postoperative day #1 was 12.6 and hematocrit 36.6.  On postoperative day #2, his hemoglobin was 12.3 with hematocrit of 35.4.  Pro time on admission was 13.2 seconds with an INR of 1.0.  PTT was 30.  On the day of discharge, on Coumadin therapy, his pro time was 17.9 seconds with an INR of 1.7.  His CMET on admission was entirely within normal limits.  His urinalysis was unremarkable on admission.  Gram stain and cultures of the right knee intraoperatively showed no organisms seen.  HOSPITAL COURSE:  The patient underwent right knee surgery.  It is well-described in Dr. Luiz Blare operative note on April 05, 2001. Postoperatively, he was admitted to the orthopedic unit on a PCA morphine pump for pain control.  IV Ancef 1 g q.8h. times five doses was given for prophylaxis.  He was also started on Coumadin and thrombolytic therapy per pharmacy protocol times two weeks postoperative for DVT prophylaxis.  Physical therapy was ordered, and a CPM machine was used.  On postoperative day #1, the patient was without complaints.  He had not gotten out of bed when the patient was seen.  He had a low grade fever of 100.2.  His hemoglobin was 12.6.  His Foley catheter that was in place at the time of surgery was discontinued.  He progressed very well with physical therapy and was ambulating with a walker very rapidly.  On postoperative day #2, the patient was ambulating in the hall.  He was taking fluids and voiding without difficulty.  He was getting pain relief with p.o. analgesics.  His vital signs were stable, and he was afebrile.  His right knee wound was benign.  His calf was soft.  His hemoglobin was 12.3, and his INR was 1.7.  His dressing was changed, and his Hemovac drains were pulled. His IV and PCA morphine pump were discontinued.  He was instructed to ambulate weightbearing as tolerated on the right with a knee immobilizer  and a walker. He was discharged to home in improved condition.  He was given a prescription for Percocet #30 with no refills p.r.n. for pain and Coumadin per pharmacy protocol times two weeks.  Home health RNs were set up for pro times and home CPM starting at 0 degrees to 80 degrees increasing to 100 degrees as tolerated was ordered.  He will follow up with Dr. Luiz Blare in one week.  He is on a regular diet at the time of discharge. Dictated by:   Currie Paris Thedore Mins. Attending Physician:  Milly Jakob DD:  05/21/02 TD:  05/22/01 Job: [id number]

## 2010-10-04 NOTE — Op Note (Signed)
[facility]  Patient:    [name], [name]                    MRN: [id number] Proc. Date: 06/05/00 Adm. Date:  [id number] Attending:  Thyra Breed                           Operative Report  PROCEDURE:  Lumbar epidural steroid injection.  DIAGNOSIS:  Lumbar degenerative disk disease with facet joint arthropathy.  ANESTHESIOLOGIST:  Thyra Breed, M.D.  INTERVAL HISTORY:  The patient noted less improvement after the second injection than the first but is here nevertheless to go ahead and get the second injection.  He has been discussing his back problems with Dr. Maple Hudson, his neurologist, who advised him that he may need a diskogram and ______ .  I explained to the patient that ______ are not panning out to be the panacea that they were initially touted as being and that most pain physicians are shifting more towards annuloplasties if diskograms are positive with better results, especially using smaller, more flexible needles.  PHYSICAL EXAMINATION:  VITAL SIGNS:  Blood pressure 142/83, heart rate 92, respiratory rate 20, O2 saturation 98%, pain level 5/10.  BACK:  Shows good healing from previous injection site.  PROCEDURE:  After informed consent was obtained, the patient was placed in the sitting position and monitored.  His back was prepped with Betadine x 3.  A skin wheal was raised at the L4-5 inner space with 1% lidocaine.  A 20-gauge Tuohy needle was introduced in the lumbar epidural space to loss of resistance to preservative free normal saline.  There was no CSF nor blood.  Medrol 80 mg and 8 ml preservative free normal saline was gently injected.  The needle was flushed with preservative free normal saline and removed intact.  POSTPROCEDURE CONDITION:  Stable.  DISCHARGE INSTRUCTIONS: 1. Resume previous diet. 2. Limitation of activities per instruction sheet. 3. Continue on current medications. 4. The patient plans to follow up with  Dr. Luiz Blare.  I encouraged him to    diskograms and possible annuloplasty with him.  I did not encourage him    to think about  ______ as explained above.  They may not be as helpful    as an annuloplasty with regard to improvement if he did have a tear in the    annulus documented on diskogram. DD:  06/05/00 TD:  06/06/00 Job: [id number]

## 2010-10-04 NOTE — Discharge Summary (Signed)
[facility]  Patient:    [name], [name]                    MRN: [id number] Adm. Date:  [id number] Disc. Date: [id number] Attending:  Milly Jakob Dictator:   Currie Paris. Thedore Mins.                           Discharge Summary  ADMISSION DIAGNOSES: 1. Degenerative joint disease right knee. 2. Hypertension.  DISCHARGE DIAGNOSES: 1. Degenerative joint disease right knee. 2. Hypertension.  PROCEDURES:  Right total knee arthroplasty on January 08, 2000.  HISTORY OF PRESENT ILLNESS:  [name] is a 55 year old male who we have seen for a long long through the office with right knee pain.  He has undergone right knee arthroscopy x 2 and then ultimately had a Fulkerson slide patellar realignment procedure of the right knee.  He continued to have significant pain in the knee with ambulation, night pain, and pain with every step.  X-rays of the right knee showed significant degenerative changes of the knee joint.  We did inject his knee in the office with plain Xylocaine, which gave him complete relief of his symptoms.  Despite exhaustive conservative treatment to his right knee, he continued to have significant problems with the knee and, therefore, was felt to be a candidate for a total knee arthroplasty.  He was admitted for this.  LABORATORY DATA:  EKG on admission showed normal sinus rhythm.  Normal EKG.  Preoperative chest x-ray showed probable chronic obstructive pulmonary disease (chronic bronchitis, no evidence of acute disease).  The patients hemoglobin on admission was 13.6, hematocrit of 39.4, WBC 4.9. On postoperative day #1, hemoglobin was 11.0; on postoperative day #2, it was 10.6; on postoperative day #3, it was 10.2.  Pro time on admission was 13 seconds with an INR of 1.1, a PTT of 26.  On the day of discharge on Coumadin therapy, his pro time was 18.7 seconds with an INR of 2.0.  His CMET on admission was basically within  normal limits, with a slightly elevated AST and ALT at 40 and 64 respectively.  He did have decreased potassium on postoperative day #1 at 3.2 and was given K-Dur for this.  A urinalysis on admission and preoperative urine culture showed no growth.  HOSPITAL COURSE:  The patient was admitted on January 08, 2000, underwent right total knee arthroplasty as well described in Dr. Luiz Blare operative note. Postoperatively, he was placed on PCA morphine pump for pain control, two days of IV Ancef 1 g q.8h., Coumadin antithrombolytic per pharmacy protocol. Physical therapy was ordered, and a CPM machine was used to the right knee to encourage range of motion.  On postoperative day #1, he had no complaints.  He was taking fluids and voiding without difficulty.  He was afebrile, and his vital signs were stable.  His hemoglobin was 11.0.  Potassium was 3.2.  He was felt to have some mild hypokalemia.  He was started on K-Dur.  He was continued with the PCA morphine pump, IV fluids, and incentive spirometry was encouraged.  On postoperative day #2, he had no complaints.  He was gotten out of bed to the chair.  He was voiding and taking p.o. without difficulty.  His vital signs were stable, he was afebrile.  His dressing was changed, and his Hemovac drain was pulled.  His knee wound was benign.  His hemoglobin was 10.6, and his INR was 1.3.  His IV was converted to a saline lock.  On postoperative day #3, the patient was doing well.  He had met his rehabilitation goals.  He was afebrile, and his vital signs were stable.  His knee wound was benign.  His calf was soft, without signs of DVT.  He was discharged home in improved condition.  Was on a regular diet.  Was given a prescription for Tylox p.r.n. for pain, and Coumadin x 1 month per [facility] pharmacy protocol.  He will resume his previous home medications. Activity status will be weightbearing as tolerated on the right with a walker. He was set  up for home health physical therapy and a home CPM machine.  CONDITION ON DISCHARGE:  Improved.  FOLLOW-UP:  He will follow up with Dr. Luiz Blare in 10 days, sooner should any problems occur. DD:  02/05/00 TD:  02/07/00 Job: [id number]

## 2010-10-04 NOTE — Op Note (Signed)
[facility]  Patient:    [name], [name]                    MRN: [id number] Proc. Date: 01/08/00 Adm. Date:  [id number] Disc. Date: [id number] Attending:  Milly Jakob                           Operative Report  PREOPERATIVE DIAGNOSIS:  Degenerative joint disease right knee.  POSTOPERATIVE DIAGNOSIS:  Degenerative joint disease right knee.  PRINCIPAL PROCEDURE:  Right total knee replacement with DePuy LCL system, large femur, large plus tibia, 10 mm bridging bearing and large patella.  SURGEON:  Harvie Junior, M.D.  ASSISTANT:  Alinda Deem, M.D. and Currie Paris. Thedore Mins.  ANESTHESIA:  General.  BRIEF HISTORY:  He is a 55 year old male with a long history of having degenerative joint disease of the knee.  He ultimately had an arthroscopy done to debride the posterior aspect of the patella.  He had a follow-up Faulkerson slide, which helped, but did not alleviate his pain.  He was followed for greater than a year at this point.  He was ultimately seen by Dr. Zollie Beckers ______ up at the [facility].  They felt that total knee replacement was the only acceptable option at this point and following this, he was brought to the operating room for evaluation under anesthesia and total knee replacement.  PROCEDURE:  Patient brought to the operating room and after adequate anesthesia was obtained with a general anesthetic, the patient placed supine on the operating table. The right knee was then prepped and draped in the usual sterile fashion.   Following this an Esmarch exsanguination was undertaken of the extremity and a blood pressure tourniquet was inflated to 350 mmHg.  Following this, a midline incision was made with extension distally of the previously made incision.  Subcutaneous tissue taken down to the level of the extensor mechanism, which was identified and medial parapatella arthrotomy was undertaken.  The distal hardware was  then removed and following this a medial parapatellar arthrotomy was finished and medial dissection was undertaken.  A fat pad was then removed and scar tissue was then removed from the lateral side and the proximal tibial plateau was removed.  A medial and lateral meniscectomy was performed in total at this point and the proximal tibia was identified and the proximal tibial plateau was resected.  At this point, the after resection of the proximal tibial plateau, a guide was put in place and a 10 mm guide was chosen and the distal femoral portion was put in place.  The jig was then put on and the distal femur was resected.  Jigs were used to match flexion, extension and balance at this point.  Following placement of this jig, the distal femur was then resected anteriorly and posteriorly and the guide was then ______ put back in place 10 mm was excellent balance both the distal femur and posterior.  Following this, the chamfer guides were put in place and the anterior and posterior chamfers were made, as well as, the cut out for the rotational portion of the femoral block. At this point, the femur was completed and attention was turned to the tibia, where a large plus was chosen and was put in place and the rotational alignment to central peg was drilled.  Following this, the knee was put through a range of motion.  Tibial and  femoral guides were put in place and a 10 mm polyethylene insert.  The knee came to full extension, it was stable, both medial and laterally and attention was then turned to the patella, the patella was measured at 25 mm and was then cut to 13.5 mm to allow access for the 11.5 mm patella.  The patellar plugs were then drilled and a trial patella was put in place.  Excellent alignment was achieved at this point and all components were then removed.  The knee was copiously irrigated, suctioned dry.  All components were then cemented into place.  The patella was  then cemented into place and the knee was put through a range of motion.  It was stable in flexion and extension and the patella tracked midline.  At this point, the components were all cemented into place and once the cement had healed, a lateral incision was made and medium Hemovac drain was put in place. The knee was then put through a range of motion and the medial parapatella arthrotomy was closed with #1 Vicryl suture.  The subcu was then closed with a 0 and 2-0 Vicryl and the skin was then closed with skin staples.  A sterile compressive dressing was applied and the patient was taken to recovery room, where he was noted to be in satisfactory condition. DD:  01/08/00 TD:  01/09/00 Job: [id number]

## 2010-10-04 NOTE — Op Note (Signed)
NAME:  [name], [name]                       ACCOUNT NO.:  [id number]   MEDICAL RECORD NO.:  [id number]                   PATIENT TYPE:  AMB   LOCATION:  DSC                                  FACILITY:  [facility]   PHYSICIAN:  Harvie Junior, M.D.                DATE OF BIRTH:  August 22, 1955   DATE OF PROCEDURE:  04/18/2002  DATE OF DISCHARGE:                                 OPERATIVE REPORT   PREOPERATIVE DIAGNOSIS:  Painful right knee status post total knee  replacement with medial-sided scar tissue and pain.   POSTOPERATIVE DIAGNOSIS:  Painful right knee status post total knee  replacement with medial-sided scar tissue and pain.   PROCEDURE:  Radical synovectomy, right knee, with manipulation.   SURGEON:  Harvie Junior, M.D.   ASSISTANT:  Marshia Ly, P.A.   ANESTHESIA:  General.   BRIEF HISTORY:  The patient is a 55 year old gentleman with a long history  of having had total knee replacement.  He ultimately underwent open  manipulation with resection of scar tissue, did well following, gained some  range of motion, and ultimately continued to have increasing pain after a  fall on that medial side.  Because of continued complaints of medial-sided  knee pain, felt that removal of scar tissue arthroscopically could benefit  him in terms of release of chronic pain on the medial side, and he was  brought to the operating room for this procedure.   DESCRIPTION OF PROCEDURE:  The patient was brought to the operating room and  after adequate anesthesia was obtained with a general anesthetic, the  patient was placed supine upon the operating table.  The right leg was then  prepped and draped in the usual sterile fashion.  Following this, the knee  was manipulated under anesthesia and went to about 105 degrees, some  breakage of scar tissue, not dramatic.  Attention was then turned to  exsanguination of the extremity and after routine prepping and draping and  exsanguination of the  extremity, a blood pressure tourniquet was inflated to  350 mmHg.  Following this, routine arthroscopic evaluation of the knee  revealed that there was obvious severe and significant scar tissue on the  medial and lateral gutters.  This was debrided with a combination of an  ArthroCare wand and a motorized suction shaver.  The massive amounts of scar  tissue were taken out of the medial side, and it was ablated significantly  to try to incur injury to any of the nervous structures which could be  innervating the medial side of the knee.  Following this attention was  turned to the patellofemoral joint, which was noted to have relatively  medial normal midline tracking.  There was a little bit of lateral tendency.  We did a synovectomy on the lateral gutter, synovectomy anteriorly as he was  having some patellar pain prior to this.  After debridement of the  patellofemoral area, the joint was evaluated.  There were a couple of  scratches on the femoral component, nothing dramatic.  At this point the  knee was copiously irrigated and suctioned dry.  The arthroscopic portals  were closed with a bandage, a sterile compressive dressing was applied, and  the patient taken to recovery, where he was noted to be in satisfactory  condition.  Estimated blood loss for this procedure was none.                                              Harvie Junior, M.D.   Ranae Plumber  D:  04/18/2002  T:  04/18/2002  Job:  [id number]

## 2010-10-04 NOTE — Procedures (Signed)
[facility]  Patient:    [name], BADY                    MRN: [id number] Proc. Date: 05/29/00 Adm. Date:  [id number] Attending:  Thyra Breed CC:         Harvie Junior, M.D.   Procedure Report  PROCEDURE:  Lumbar epidural steroid injection.  DIAGNOSES:  Lumbar degenerative disk disease and facet joint arthropathy.  INTERVAL HISTORY:  The patient has noted a moderate improvement after his first injection.  He wishes to proceed with his second injection today.  EXAMINATION:  Blood pressure is 138/79, heart rate is 92, respiratory rate is 16, O2 saturation is 90%.  Pain level is 2/10.  He shows good healing from his previous injection site.  DESCRIPTION OF PROCEDURE:  After informed consent was obtained, the patient was placed in the sitting position and monitored.  His back was prepped with Betadine x 3.  A skin well was raised at the L4-L5 interspace with 1% lidocaine.  A 20 gauge Tuohy needle was introduced into the lumbar epidural space to a loss of resistance to preservative-free saline.  There is no CSF nor blood.  Medrol 80 mg and 8 mL of preservative-free normal saline was gently injected.  The needle was flushed with preservative-free normal saline and removed intact.  POSTPROCEDURE CONDITION:  Stable.  DISCHARGE INSTRUCTIONS: 1. Resume previous diet. 2. Limitations activities per instruction sheet. 3. Continue on current medications. 4. Follow up with me in 1-2 weeks for a repeat injection. DD:  05/29/00 TD:  05/30/00 Job: [id number]

## 2010-10-04 NOTE — Procedures (Signed)
[facility]  Patient:    [name], [name]                    MRN: [id number] Proc. Date: 05/15/00 Adm. Date:  [id number] Attending:  Thyra Breed CC:         Harvie Junior, M.D.   Procedure Report  PREOPERATIVE DIAGNOSIS:  Lumbar degenerative disk disease and facet joint arthropathy.  POSTOPERATIVE DIAGNOSIS:  Lumbar degenerative disk disease and facet joint arthropathy.  PROCEDURE:  Lumbar epidural steroid injection.  SURGEON:  Thyra Breed, M.D.  INTERVAL HISTORY:  The patient is a 55 year old gentleman sent to [place] by Dr. Harvie Junior for a series of lumbar epidural steroid injections.  The patient states that he was in his usual state of health up until October of 2001, when he noted a constant sharp lower back discomfort, made worse by bending over, and improved by laying on the stomach.  He was seen by Dr. Luiz Blare and treated conservatively with a trial of TENS unit which he finds helpful, and nonsteroidals including Vioxx and Celebrex which were not helpful, and opiates in the form of hydrocodone which he found very unhelpful. Because of his lack of improvement, he underwent a MRI of his lumbar spine on April 28, 2000, which showed disk bulging at 3-4, 4-5, and 5-S1 with some facet joint arthropathy to a mild extent.  He denies any numbness or tingling, bowel or bladder incontinence or weakness.  Of note, the patient has had a recent total knee replacement on the right side in August of 2001, and was fairly inactive following this, and attributes much of his discomfort to the significant inactivity that he sustained.  CURRENT MEDICATIONS:  Include Diovan, Claritin D, Lasix, and Topamax.  ALLERGIES:  CODEINE CAUSES NAUSEA.  FAMILY HISTORY:  Positive for osteoarthritis, diabetes, hypertension, and coronary artery disease.  PAST SURGICAL HISTORY:  Significant for four right knee surgeries, one left knee surgery, foot surgeries x 2  for heel spurs and hammertoes, cholecystectomy, appendectomy, wrist surgery for torn cartilage, uvuloplasty for sleep apnea, tonsillectomy, and two moles removed from his back.  ACTIVE MEDICAL PROBLEMS:  Include hypertension, sleep apnea for which he requires a CPAP machine, allergic rhinitis, migraine headaches for which he sees Dr. _______, COPD which is described as early, and he quit smoking a year ago because of this, gastroesophageal reflux disease of which he described as mild, mildly abnormal glucose tolerance test for which he is treated with diet, and depression which he was formerly on Paxil for.  SOCIAL HISTORY:  The patient quit smoking in 2000 after 30 years of smoking. He does not drink alcohol.  He has not worked for two years because of his knees, and he formerly worked in the [employer].  REVIEW OF SYSTEMS:  GENERAL:  Negative.  HEAD:  Significant for migraines for which he sees Dr. ________ in [facility].  EYES:  Negative.  NOSE, MOUTH, AND THROAT:  Negative for allergic rhinitis.  EARS:  Negative.  PULMONARY: Significant for early COPD on x-rays.  CARDIOVASCULAR:  Significant for chronic hypertension.  GI:  Significant for mild gastroesophageal reflux disease.  GU:  Negative.  MUSCULOSKELETAL AND NEUROLOGIC:  See HPI.  No history of seizure or stroke.  CUTANEOUS:  Negative except for acne. ENDOCRINE:  Abnormal glucose tolerance test, otherwise negative.  ______: Negative.  PSYCHIATRIC:  Positive for depression.  ALLERGY AND IMMUNOLOGIC: Positive for allergies for which he takes Claritin.  PHYSICAL EXAMINATION:  Blood pressure is 138/75, heart rate is 98, respiratory rate is 18, and O2 saturations 98%.  Pain level is 9 out of 10.  This is a robust male in modest discomfort.  Head was normocephalic and atraumatic. Eyes - extraocular movements intact with conjunctivae and sclerae clear.  Nose patent.  Nares without discharge.  Oropharynx was free of  lesions.  Neck demonstrated good range of motion with carotids 2+ and symmetric without bruits.  The lungs were clear.  Heart was regular rate and rhythm.  Abdomen obese.  Genitalia and rectal exams were not performed.  Back exam revealed negative straight leg raise signs with no tenderness over the SI joints to palpation.  He had some midline lumbar tenderness.  Extremities - no cyanosis, clubbing, nor edema with radial pulses and dorsalis pedis pulses 2+ and symmetric.  He does have benefits of previous surgery of his right knee with a well-healed surgical scar.  Neurologic - the patient was oriented x 4. Cranial nerves 2-12 are grossly intact.  Deep tendon reflexes were symmetric in the upper and lower extremities with downgoing toes.  Motor was 5/5 with symmetric bulk and tone.  Sensory was intact to vibratory sense and pinprick except over the lateral aspect of the right knee where there was absence of pinprick.  Coordination was grossly intact.  IMPRESSION: 1. Low back pain, predominantly on the basis of lumbar degenerative disk    disease exacerbated by forward flexion with minimal facet joint    arthropathy. 2. Other medical problems per primary care physician which include    hypertension, sleep apnea, allergic rhinitis, migraines, chronic    obstructive pulmonary disease, gastroesophageal reflux disease, history of    depression, and mildly abnormal glucose tolerance test.  DISPOSITION:  I discussed the nature, risks, benefits, and limitations of the lumbar epidural steroid injection in detail with the patient as well as the side effects of corticosteroids.  The patient is interested in pursuing this. His questions were answered.  DESCRIPTION OF PROCEDURE:  After informed consent was obtained, the patient was placed in the sitting position and monitored.  His back was prepped with Betadine x 3.  The skin level was raised at the L4-5 interspace with 1% lidocaine.  A 20-gauge  Tuohy needle was introduced into the lumbar epidural  space with loss of resistance to preservative-free normal saline.  There was no CSF nor blood.  The depth was 7.5 cm.  Eighty milligrams of Medrol and 8 ml of preservative-free normal saline was gently injected.  The needle was flushed with preservative-free normal saline and removed intact.  POSTPROCEDURE CONDITION:  Stable.  DISCHARGE INSTRUCTIONS: 1. Resume previous diet. 2. Limitations and activities per instruction sheet. 3. Continue on current medications. 4. Followup with me in 1-2 weeks for repeat injection. DD:  05/15/00 TD:  05/16/00 Job: [id number]

## 2010-10-04 NOTE — Op Note (Signed)
[name], [name]             ACCOUNT NO.:  [id number]   MEDICAL RECORD NO.:  [id number]          PATIENT TYPE:  INP   LOCATION:  2550                         FACILITY:  [facility]   PHYSICIAN:  Rodney A. Mortenson, M.D.DATE OF BIRTH:  [birth year]   DATE OF PROCEDURE:  05/26/2006  DATE OF DISCHARGE:                               OPERATIVE REPORT   PREOPERATIVE DIAGNOSIS:  Osteoarthritis medial compartment left knee.   POSTOPERATIVE DIAGNOSIS:  Osteoarthritis medial compartment left knee.   OPERATION:  Proximal opening wedge tibial osteotomy, left leg, using  Tudo plate 82.9 mm and bone grafting.   SURGEON:  Lenard Galloway. Chaney Malling, M.D.   ASSISTANT:  Legrand Pitts. Duffy, P.A.-C.   ANESTHESIA:  General.   PROCEDURE:  The patient was placed on the operating table in supine  position with pneumatic tourniquet about the left upper thigh.  The  entire left lower extremity was prepped with DuraPrep and draped out in  the usual manner.  The leg was then wrapped out with an Esmarch and the  tourniquet was elevated.  A drape had been placed over the operative  site.  An incision was started at the level of the joint line on the  medial side and carried distally just slightly anterior to the medial  collateral ligament.  Skin edges were retracted.  Bleeders were  coagulated.  Self-retaining retractor was put in the wound.  The  proximal capsular area of the proximal medial side of the knee was then  exposed.  C-arm was used throughout the operative suture.  A 3 mm guide  pin was placed about 1.5 to 2 cm distal to the joint line and placed  parallel to the joint line from the medial side to the lateral side in  and stopping about 1 cm from the lateral cortex.  Excellent alignment of  the guide pin was achieved.  The external guide was then placed over the  guide pin and set at the appropriate angle.  Two smaller guide pins were  passed up the sleeves on the assembly and this showed that the  ostomy  site would be above the tibial tubal.  This was locked in position.  All  positions were checked with the C-arm throughout the procedure.  The  cutting plate was then inserted and locked in position with a K-wire.  Using a power saw, an ostomy of the proximal tibia on the either side  was made in both the anterior and posterior cortex and the osteotomy was  completed laterally to within 1 cm of the lateral cortex using  osteotomes.  This was done very carefully throughout the procedure using  the C-arm and rotating the leg very carefully.  Once this was done to my  satisfaction, the leg was placed in valgus and this hinged laterally.  The lateral cortex was preserved.  The tines were inserted and the wedge  tines were driven into the leg and this measured 17.5 mm of distraction  on the medial side.  This showed good realignment.  A Bovie wire had  been passed through the femoral head down through  the knee to midway  between medial and lateral malleoli.  Prior to the osteotomy, the guide  wire was far medial in the medial compartment.  After the ostomy was  opened up to 17.5 mm, the Bovie wire was moved slightly into the lateral  compartment.  This was with nice realignment.  The 17.5 mm Tudo plate  was placed between the tines.  This was fixed proximally with the larger  cancellus screws.  The tines were then removed and the plate allowed to  load and drill holes were placed through the two holes distally and the  distal cortical screws were then used through two cortices in the  standard manner.  Excellent fixation of Tudo plate was achieved.  The  bone graft was then used to fill up the osteotomy site and excellent  stability of the bone graft was achieved.  This was all done under  radiographic control.  I was very pleased with the construct as it was  finished.  All the bone graft fit very tightly, the Tudo plate was  extremely snug.  The knee was put through a full range of  motion and was  quite stable.  The soft tissues was closed with interrupted Tycron  sutures.  Vicryl was used for the subcutaneous tissues  and stainless steel staples used to close the skin.  A large bulky  compressive dressing and a knee immobilizer was applied and the patient  placed in the hinged knee brace placed in the CPM machine  postoperatively.  Drains none.  Complications none.           ______________________________  Lenard Galloway. Chaney Malling, M.D.     RAM/MEDQ  D:  05/26/2006  T:  05/26/2006  Job:  [id number]

## 2010-10-04 NOTE — Op Note (Signed)
[facility]  Patient:    [name], [name] Visit Number: [id number] MRN: [id number]          Service Type: SUR Location: [id number] Attending Physician:  Milly Jakob Dictated by:   Harvie Junior, M.D. Proc. Date: 04/05/01 Admit Date:  04/05/2001                             Operative Report  PREOPERATIVE DIAGNOSIS:  Painful total knee.  POSTOPERATIVE DIAGNOSIS:  Painful total knee.  PRINCIPAL PROCEDURE:  Total knee revision of tibial insert and patellar insert.  SURGEON:  Harvie Junior, M.D.  ASSISTANT:  Currie Paris. Thedore Mins.  ANESTHESIA:  General.  BRIEF HISTORY:  Mr. Wingert is 55 year old male with a long history of having had multiple procedures including knee arthroscopy including _____ . He ultimately continued to have pain and ultimately underwent a total knee replacement because of persistent pain.  The patient underwent bone scanning which showed his components to be hot.  There was some question about looseness.  We treated him with Fosamax for three months.  No improvement in the pain.  Because of the persistent, continued complaints of pain, he was ultimately taken to the operating room for evaluation of this total knee. I reviewed his x-rays thoroughly preoperatively and felt that the alignment was essentially perfect and the fit of the components seemed to be perfect. There was no evidence of radiolucency or lucency; however, the bone scan did show some hotness and the patient was taken to the operating room for evaluation.  DESCRIPTION OF PROCEDURE:  The patient was brought to the operating room. After adequate anesthesia was obtained, the patient was placed on the operating table.  The right leg was prepped and draped in the usual sterile fashion.  Following this, the leg was exsanguinated _____ and it was inflated to 350 mmHg.  Following this, an incision was made in line with the old incision and  subcutaneous tissue and extended down to the level of the extensor mechanism and medial parapatellar arthrotomy was undertaken.  The joint fluid was cultured but appeared within normal limits.  Following this, attention was turned towards excising excessive scar tissue from the underborder of the quadriceps mechanism, both medially and laterally.  A large amount of scar tissue was removed.  A medial sleeve was raised and the patella was everted gradually and the tibia was brought forward and the polyethylene liner was removed.  It was inspected and noted to have no pit, no cracking, no evidence of significant areas of compression.  Attention was then turned to the tibial component where an osteotome was used to hammer the tibial component.  No evidence of loosening.  Following this, attention was turned to the femoral component where the femoral component was attempted to be pried free and there was no evidence of loosening of the femoral component.  At this point attention was turned to the patellar button where the patellar button was excised but the patellar tendon was noted to be tight and the patellar retinaculum was noted to be tight.  A lateral retinacular release was performed.  The patella was able to evert.  At this point the trial component was put back in place of a 10 mm and excellent extension was achieved as well as range of motion.  Better motion in terms of flexion and extension and ease of flexion and extension.  At  this point a new polyethylene liner was opened and attempted to be put in place.  There was some significant crushing of the polyethylene component when we tried to go into full extension.  This polyethylene component was then removed and a new polyethylene component was opened and this was then put in place and excellent range of motion was achieved.  The patellar button was then put back in place on the patellar component and full range of motion was achieved.   At this point the wound was copiously irrigated with normal saline of lavage irrigation.  The hand-held pulse lavage and the medial parapatellar arthrotomy was closed with interrupted #1 Ethibond sutures.  The subcu was then closed with 0 and 2-0 Vicryl, skin with skin staples.  Sterile compression dressing was applied and medium Hemovac drains had been placed prior to closure of the quadriceps mechanism.  The medium Hemovac drains had been applied previously.  Sterile pressure dressing was applied.  The patient was taken to the recovery room and was noted to be in satisfactory condition. Dictated by:   Harvie Junior, M.D. Attending Physician:  Milly Jakob DD:  04/05/01 TD:  04/05/01 Job: [id number]

## 2010-10-04 NOTE — Op Note (Signed)
[name], [name]             ACCOUNT NO.:  [id number]   MEDICAL RECORD NO.:  [id number]          PATIENT TYPE:  AMB   LOCATION:  SDS                          FACILITY:  [facility]   PHYSICIAN:  Danae Orleans. Venetia Maxon, M.D.  DATE OF BIRTH:  1955-08-06   DATE OF PROCEDURE:  05/06/2005  DATE OF DISCHARGE:                                 OPERATIVE REPORT   PREOPERATIVE DIAGNOSIS:  Chronic intractable pain with multiple knee  surgeries and knee arthritis with status post successful intrathecal  Dilaudid trial for chronic pain management.   POSTOPERATIVE DIAGNOSIS:  Chronic intractable pain with multiple knee  surgeries and knee arthritis with status post successful intrathecal  Dilaudid trial for chronic pain management.   PROCEDURE:  Placement of intrathecal Dilaudid pump.   SURGEON:  Danae Orleans. Venetia Maxon, M.D.   ASSISTANT:  None.   ANESTHESIA:  General endotracheal anesthesia   ESTIMATED BLOOD LOSS:  Minimal.   COMPLICATIONS:  None.   DISPOSITION:  To recovery.   INDICATIONS:  [name] is a 55 year old male with chronic intractable  lower extremity pain who is working and attempting to maintain an active  life style but is having severe pain and had an intrathecal Dilaudid trial  which gave him significant relief of his pain. It was elected to take him to  surgery for placement of intrathecal Dilaudid pump.   DESCRIPTION OF PROCEDURE:  [name] was brought to the operating room.  Following the satisfactory and uncomplicated induction of general  endotracheal anesthesia and placement of intravenous lines, the patient was  placed in the right lateral decubitus position on a bean bag with an  axillary roll.  The soft tissues and bony prominences were padded  appropriately.  His back, abdomen, and left flank were then shaved, prepped  and draped in the usual sterile fashion with Betadine scrub and pain.  Because of the patient's large body habitus it was elected to perform a  cutdown prior to placing the spinal Touhy needle, and this was done after  fluoroscopic localization.   Incision was made lateral to the left L-5 pedicle and carried to the  lumbodorsal fascia which was approximately 3 inches deep from the skin; and  then the spinal needle was then placed with brisk return of CSF.  The  intrathecal catheter was then threaded into the subarachnoid space and its  position was confirmed on fluoroscopy with the catheter at the level of T-6.  An anchor was then placed and sutured to the lumbodorsal fascia.  An  abdominal pocket was created and dissection was performed at the level of  the Scarpa's fascia placing the pump within the subcutaneous layer.  Subsequently the 2-0 silk stitches were placed to anchor the pump.  A  tunneler was then used to tunnel from back to pocket and the two-piece  catheter assembly was inserted through subcutaneous tunnel, was then hooked  with the metal connector and Silastic sleeve and anchored appropriately  through subcutaneous tissues with a 2-0 silk stitch.  Spontaneous flow of  CSF was identified from the end of the distal catheter.  This was  then  hooked to the pump and anchored as well.  The pump was then inserted into  the subcutaneous pocket, anchored with stay sutures, and all wounds were  then irrigated and closed with 2-0 Vicryl and 3-0 Vicryl interrupted  inverted sutures.  The wounds were dressed with benzoin and Steri-Strips,  telfa gauze, and tape.  The patient was extubated in the operating room and  taken to the recovery room in stable and satisfactory condition having  tolerated his operation well.      Danae Orleans. Venetia Maxon, M.D.  Electronically Signed     JDS/MEDQ  D:  05/06/2005  T:  05/07/2005  Job:  [id number]

## 2010-10-04 NOTE — Op Note (Signed)
[facility]  Patient:    [name], [name]                      MRN: [id number]. Date: 09/04/99 Attending:  Harvie Junior, M.D.                           Operative Report  PREOPERATIVE DIAGNOSIS:  Status post Fulkerson ______ with persistent medial ______ .  POSTOPERATIVE DIAGNOSIS:  Status post Fulkerson ______ with persistent medial ______.  PRINCIPAL PROCEDURES: 1. Medial femoral chondroplasty. 2. Debridement of scar tissue in the medial retinaculum with the Arthrocare wand.  SURGEON:  Harvie Junior, M.D.  ASSISTANT:  Kerby Less, P.A.  ANESTHESIA:  General.  BRIEF HISTORY:  The patient is a 55 year old male with a long history of having had severe right knee pain seeming to be patellofemoral in origin.  Ultimately, he ad a knee scope with lateral release.  He ultimately then went on to a Fulkerson ______.  He improved, but still had continued complaints of pain, mostly on the  medial side.  Because of his continued complaints of pain, he is brought back for arthroscopy.  DESCRIPTION OF PROCEDURE:  The patient was brought to the operating room. After adequate anesthesia was obtained with general anesthetic, the patient was placed supine on the operating table.  The right leg was prepped and draped in the usual sterile fashion.  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  This corresponded to the patients pain.  The medial retinacular area was then debrided with the Arthrocare ablation wand to try to control bleeding.  A significant amount  of scar tissue around the medial portal and anterior portion of the fat pad were resected, as well as on the medial portion of the patella.  At this point, the Arthrocare wand was used on a low setting to smooth the articular cartilage defects on the medial side and, once this was  accomplished, the camera was then changed  medially and scar tissue was resected laterally around the area of the lateral retinacular release.  This was done with a combination of Arthrocare wand and the sucker shaver.  Following this, the wound was copiously irrigated and suctioned  dry.  The lateral portal had been examined during the case and noted to be normal. The lateral side of the knee was examined and noted to be normal.  The ACL was noted to be normal.  At this point, the patient was taken to the recovery room,  where he was noted to be in satisfactory condition.  Estimated blood loss for the procedure was none. DD:  09/04/99 TD:  09/04/99 Job: [id number]

## 2010-10-04 NOTE — Discharge Summary (Signed)
NAMEJANMICHAEL, Wolfe             ACCOUNT NO.:  [id number]   MEDICAL RECORD NO.:  [id number]          PATIENT TYPE:  OIB   LOCATION:  5015                         FACILITY:  [facility]   PHYSICIAN:  Rodney A. Mortenson, M.D.DATE OF BIRTH:  1955/10/09   DATE OF ADMISSION:  11/11/2005  DATE OF DISCHARGE:  11/12/2005                                 DISCHARGE SUMMARY   ADMISSION DIAGNOSES:  1.  Torn medial meniscus. left knee.  2.  Hypertension.  3.  History of headaches.  4.  History of low back pain.   DISCHARGE DIAGNOSES:  1.  Torn medial meniscus. left knee. Status post left knee arthroscopy.  2.  Hypertension.  3.  History of headaches.  4.  History of low back pain.   SURGICAL PROCEDURES:  On November 11, 2005 Mr. Heggs underwent a left knee  arthroscopy with a partial medial meniscectomy by Dr. Rinaldo Ratel.   COMPLICATIONS:  None.   CONSULTANT:  None.   HISTORY OF PRESENT ILLNESS:  This 55 year old white male patient presented  to Dr. Chaney Malling with a history of an injury to his left knee in April of  this year.  He stepped out of the back of his bread truck and twisted that  knee.  He was having pain along the medial joint line and it is pretty much  constant and severe.  There has been localized swelling and pain was getting  worse.  X-rays were taken which showed some early osteoarthritis of the  medial compartment of the knee.  He has failed conservative treatment and,  because of a high probability of internal derangement,  he is admitted for a  left knee arthroscopy.   HOSPITAL COURSE:  Mr. Vaneaton tolerated surgical procedure well without  immediate postoperative complications.  He was transferred to 5000.  On  postop day #1, he is afebrile, vitals were stable.  Pain is well-controlled  with meds.  He is ambulating with minimal difficulty.  Felt he is stable for  discharge home and he will be discharged home later today.   DISCHARGE INSTRUCTIONS:  Diet:  He  is to resume his regular  prehospitalization diet.   MEDICATIONS:  He may resume his home meds.  These include:  1.  Diovan 320/hydrochlorothiazide 25 mg 1 tablet by mouth in the morning.  2.  Zoloft 100 mg by mouth in the morning.  3.  Claritin D 1 tablet by mouth in the morning.  4.  Provigil 1 tablet by mouth in the morning.  5.  Cadian as needed.  6.  Aleve as needed.  7.  For additional meds at this time will be Cadian 10 mg 1 tablet by mouth      every morning # 30 with no refill.   ACTIVITY:  He can be out of bed weightbearing as tolerated on the left knee  with crutches as needed.  He is to keep the knee incision clean and dry and  may change the dressing on November 13, 2005.  He needs to notify Dr. Chaney Malling  of any signs of infection and when he is  resting, he needs to elevate that  left leg above the heart as much as possible.   DISCHARGE FOLLOWUP:  He needs to follow up with Dr. Chaney Malling in our office  on November 17, 2005, needs to call [phone] for that appointment.   LABORATORY DATA:  On November 05, 2005 all lab studies were within normal  limits.      Legrand Pitts Duffy, P.A.    ______________________________  Lenard Galloway Chaney Malling, M.D.    KED/MEDQ  D:  11/12/2005  T:  11/12/2005  Job:  [id number]   cc:   Thereasa Distance A. Chaney Malling, M.D.  Fax: [phone]

## 2010-10-04 NOTE — Discharge Summary (Signed)
[name], [name]             ACCOUNT NO.:  [id number]   MEDICAL RECORD NO.:  [id number]          PATIENT TYPE:  INP   LOCATION:  5038                         FACILITY:  [facility]   PHYSICIAN:  Rodney A. Mortenson, M.D.DATE OF BIRTH:  09-14-1955   DATE OF ADMISSION:  10/03/2008  DATE OF DISCHARGE:  10/06/2008                               DISCHARGE SUMMARY   ADMISSION DIAGNOSIS:  Osteoarthritis, left knee.   DISCHARGE DIAGNOSES:  1. Osteoarthritis, left knee.  2. History of hypertension.  3. History of sleep apnea surgery.  4. History of on Zoloft for road rage.  5. Obesity.  6. Multiple tattoos.  7. History of methicillin-resistant Staphylococcus aureus.   PROCEDURE:  Left total knee arthroplasty.   HISTORY:  [name] is a 55 year old white male with chronic left  knee pain.  In January 2008, he had an opening wedge osteotomy of the  proximal tibia.  In July 2008, he developed an infection at the site and  an I and D performed and hardware removed and insertion of antibiotic  spacer.  In September 2008, he had removal of spacer and ORIF with  autograft in iliac crest on the left.  In April 2009, he had an I and D  and a wound VAC placed.  Also, he had a subsequent loss of flap to the  left knee.  Because of his osteoarthritis, he is now indicated for left  total knee arthroplasty.   HOSPITAL COURSE:  A 55 year old white male admitted on Oct 03, 2008.  After appropriate laboratory studies were obtained as well as 1 g of  vancomycin IV on-call in the operating room, he was taken to the  operating room and at that time, placed with foot pump to the right leg  intraoperatively.  He underwent a left total knee arthroplasty.  He  tolerated the procedure well.  He was placed with a reduced dose of PCA  pump.  He also was placed with a CPM.  He was started on Lovenox 30 mg  subcu q.12 h.  Knee immobilizer placed to the left knee to prevent  dislocation of the hip.  Consults  with PT, OT, care management were  made.  He was allowed to be weightbearing as tolerated.  The remainder  of his hospital course was uneventful, and he was discharged on November 06, 2008, to return back to the office and followup.   EKG was normal.   LABORATORY DATA:  Hemoglobin 13.9, hematocrit 40.0%, white count 6800,  platelets 148,000; these were preop.  Discharge hemoglobin 9.3,  hematocrit 27.2%, white count 8800, platelets 124,000.  Preop sodium  140, potassium 3.9, chloride 104, CO2 31, glucose 116, BUN 11,  creatinine 0.74.  Discharge sodium 136, potassium 3.5, chloride 95, CO2  35, glucose 118, BUN 9, creatinine 0.62.  GFRs were greater than 60  throughout this hospital course.  Total protein preop 6.6, albumin 3.6,  AST 45, ALT 49, ALP 73, and total bilirubin 0.4.  Urine showed a trace  of hemoglobin, 0-2 white, 3-6 reds, and rare bacteria.  Blood type A  positive, antibody  screen negative.  Cultures taken intraoperatively  showed no growth.  Urine culture showed no growth.   DISCHARGE INSTRUCTIONS:  He will be discharged on a regular diet.  Use  crutches and walk with assistance.  See Dr. Chaney Malling on approximately  October 17, 2008.   MEDICATIONS:  1. Robaxin 500 1 tablet every 8 hours as needed for spasms.  2. Lovenox 30 mg injection q.12 h.  3. Percocet 5/325 one to tabs every 4-6 hours as needed for pain.   Call if temperature is greater than 101 or chills or vomiting.  Discharged in improved condition.      Oris Drone Petrarca, P.A.-C.      Rodney A. Chaney Malling, M.D.  Electronically Signed    BDP/MEDQ  D:  10/31/2008  T:  11/01/2008  Job:  [id number]

## 2010-10-04 NOTE — Op Note (Signed)
NAME:  [name], [name]                       ACCOUNT NO.:  [id number]   MEDICAL RECORD NO.:  [id number]                   PATIENT TYPE:  OIB   LOCATION:  3172                                 FACILITY:  [facility]   PHYSICIAN:  Danae Orleans. Venetia Maxon, M.D.               DATE OF BIRTH:  [birth year]   DATE OF PROCEDURE:  01/10/2003  DATE OF DISCHARGE:                                 OPERATIVE REPORT   PREOPERATIVE DIAGNOSIS:  Intractable leg pain.   POSTOPERATIVE DIAGNOSIS:  Intractable leg pain.   OPERATION PERFORMED:  Placement of implantable dorsal column stimulator with  implantable pulse generator with T10 laminectomy.   SURGEON:  Danae Orleans. Venetia Maxon, M.D.   ANESTHESIA:  General endotracheal.   ESTIMATED BLOOD LOSS:  Minimal.   COMPLICATIONS:  None.   DISPOSITION:  Recovery.   INDICATIONS FOR PROCEDURE:  [name] is a 55 year old man with  intractable right leg pain. He has undergone five prior knee surgeries and  multiple knee arthroplasty procedures.  He had a dorsal column stimulator  trial which was effective in relieving his pain.  It was elected to take him  to surgery for placement of permanent implantable dorsal column stimulator  with pulse generator.   DESCRIPTION OF PROCEDURE:  [name] was brought to the operating room.  Following satisfactory and uncomplicated induction of general endotracheal  anesthesia and placement of intravenous lines, the patient was placed in  prone position on the operating table.  His right buttock and midback were  then shaved, prepped and draped in the usual sterile fashion with Betadine  scrub and paint.  Prior to prepping, the planned incision was marked out.  Incision was then made in the midline, carried on the right side of midline  to the T10-11 interspace which was confirmed on AP fluoroscopy.  A  hemisemilaminotomy of T10 was then performed and subsequently the spinal  cord dura was identified and the epidural space was  very carefully exposed  using  paddle dissector.  The hinged [employer] paddle lead was then inserted  in the epidural space and its positioning was confirmed in the midline under  AP fluoroscopy.  Subsequently the thoracic incision was closed to the  fascia.  Anchors were placed around the leads and a tunnel was then created  to the right posterior iliac crest pocket which was created for the  implantable pulse generator.  The leads were tunneled and then connected and  the suture boots were placed overlying the lead connections after they were  tightened with a torque wrench.  2-0 silk ties were placed around these  connections.  The leads were then connected to a battery which was inserted  into the subcutaneous pocket and  wounds were then irrigated with bacitracin saline and they were all closed  with interrupted Vicryl sutures.  The skin was dressed with Dermabond and  sterile occlusive dressing was placed.  The patient  appeared to tolerate the  procedure well.  Counts were correct at the end of the case.                                                Danae Orleans. Venetia Maxon, M.D.    JDS/MEDQ  D:  01/10/2003  T:  01/10/2003  Job:  [id number]

## 2011-02-11 LAB — CULTURE, ROUTINE-ABSCESS

## 2011-02-11 LAB — TISSUE CULTURE

## 2011-02-11 LAB — URINALYSIS, ROUTINE W REFLEX MICROSCOPIC
Bilirubin Urine: NEGATIVE
Glucose, UA: NEGATIVE
Ketones, ur: NEGATIVE
Leukocytes, UA: NEGATIVE
Nitrite: NEGATIVE
Protein, ur: NEGATIVE

## 2011-02-11 LAB — PROTIME-INR: Prothrombin Time: 12.7

## 2011-02-11 LAB — CBC
HCT: 40.2
Hemoglobin: 13.6
MCHC: 33.7
MCV: 86.2
RBC: 4.66
WBC: 7.5

## 2011-02-11 LAB — ANAEROBIC CULTURE

## 2011-02-11 LAB — COMPREHENSIVE METABOLIC PANEL
CO2: 30
Calcium: 9.2
Chloride: 101
Creatinine, Ser: 0.81
GFR calc non Af Amer: >60
Glucose, Bld: 102 — ABNORMAL HIGH
Total Bilirubin: 0.6

## 2011-02-11 LAB — URINE MICROSCOPIC-ADD ON

## 2011-02-11 LAB — APTT: aPTT: 29

## 2011-02-27 LAB — URINALYSIS, ROUTINE W REFLEX MICROSCOPIC
Ketones, ur: NEGATIVE
Leukocytes, UA: NEGATIVE
Protein, ur: NEGATIVE
Urobilinogen, UA: 1

## 2011-02-27 LAB — BASIC METABOLIC PANEL
CO2: 31
Calcium: 8.3 — ABNORMAL LOW
Chloride: 97
Creatinine, Ser: 0.69
GFR calc Af Amer: >60
Glucose, Bld: 106 — ABNORMAL HIGH
Sodium: 135

## 2011-02-27 LAB — CBC
HCT: 31.1 — ABNORMAL LOW
Hemoglobin: 10.5 — ABNORMAL LOW
Hemoglobin: 9.7 — ABNORMAL LOW
MCHC: 34.4
MCV: 85.9
RBC: 3.3 — ABNORMAL LOW
RBC: 3.59 — ABNORMAL LOW
RDW: 14.7 — ABNORMAL HIGH
RDW: 15.2 — ABNORMAL HIGH
WBC: 14.9 — ABNORMAL HIGH

## 2011-02-27 LAB — COMPREHENSIVE METABOLIC PANEL
ALT: 23
Alkaline Phosphatase: 77
BUN: 8
CO2: 30
Chloride: 98
Glucose, Bld: 107 — ABNORMAL HIGH
Potassium: 3.6
Sodium: 134 — ABNORMAL LOW
Total Bilirubin: 0.6
Total Protein: 6.5

## 2011-02-27 LAB — DIFFERENTIAL
Basophils Relative: 0
Eosinophils Absolute: 0.2
Eosinophils Relative: 1
Lymphs Abs: 1.5
Neutrophils Relative %: 82 — ABNORMAL HIGH

## 2011-02-27 LAB — SEDIMENTATION RATE: Sed Rate: 91 — ABNORMAL HIGH

## 2011-02-27 LAB — HIGH SENSITIVITY CRP: CRP, High Sensitivity: 198.4 — ABNORMAL HIGH

## 2011-02-27 LAB — URINE MICROSCOPIC-ADD ON

## 2011-02-28 LAB — CBC
MCHC: 33.6
MCHC: 33.7
MCV: 86.9
Platelets: 190
RBC: 3.57 — ABNORMAL LOW
RBC: 4.77
RDW: 14.9 — ABNORMAL HIGH

## 2011-02-28 LAB — COMPREHENSIVE METABOLIC PANEL
ALT: 46
AST: 39 — ABNORMAL HIGH
Albumin: 3.9
Calcium: 9.1
Creatinine, Ser: 0.72
GFR calc Af Amer: >60
GFR calc non Af Amer: >60
Sodium: 136
Total Protein: 7.5

## 2011-02-28 LAB — BASIC METABOLIC PANEL
CO2: 33 — ABNORMAL HIGH
Calcium: 7.9 — ABNORMAL LOW
Creatinine, Ser: 0.68
GFR calc Af Amer: >60
GFR calc non Af Amer: >60

## 2011-02-28 LAB — URINE MICROSCOPIC-ADD ON

## 2011-02-28 LAB — URINALYSIS, ROUTINE W REFLEX MICROSCOPIC
Bilirubin Urine: NEGATIVE
Glucose, UA: NEGATIVE
Ketones, ur: NEGATIVE
Nitrite: NEGATIVE
Specific Gravity, Urine: 1.025
pH: 6.5

## 2011-03-04 LAB — COMPREHENSIVE METABOLIC PANEL
ALT: 40
AST: 36
Albumin: 3 — ABNORMAL LOW
Calcium: 9.2
GFR calc Af Amer: >60
Sodium: 136
Total Protein: 7.4

## 2011-03-04 LAB — CBC
HCT: 33.9 — ABNORMAL LOW
Hemoglobin: 11.6 — ABNORMAL LOW
MCHC: 34.1
MCV: 86.9
Platelets: 304
RBC: 3.9 — ABNORMAL LOW
RDW: 14.5 — ABNORMAL HIGH
WBC: 8.2

## 2011-03-04 LAB — I-STAT 8, (EC8 V) (CONVERTED LAB)
BUN: 14
Bicarbonate: 28.3 — ABNORMAL HIGH
HCT: 44
Hemoglobin: 15
Operator id: 279391
Potassium: 4.1
Sodium: 134 — ABNORMAL LOW
TCO2: 29

## 2011-03-04 LAB — BASIC METABOLIC PANEL
BUN: 12
Chloride: 98
Potassium: 4.5

## 2011-03-04 LAB — ANAEROBIC CULTURE

## 2011-03-04 LAB — CULTURE, ROUTINE-ABSCESS: Gram Stain: NONE SEEN

## 2011-03-04 LAB — TISSUE CULTURE: Culture: NO GROWTH

## 2011-03-04 LAB — PROTIME-INR: Prothrombin Time: 13.9

## 2011-03-05 LAB — PROTIME-INR
INR: 1.1
Prothrombin Time: 14

## 2011-03-05 LAB — COMPREHENSIVE METABOLIC PANEL
Albumin: 3.8
BUN: 17
Calcium: 9
Creatinine, Ser: 0.95
Total Protein: 7.4

## 2011-03-05 LAB — DIFFERENTIAL
Lymphs Abs: 0.9
Monocytes Relative: 4
Neutro Abs: 12 — ABNORMAL HIGH
Neutrophils Relative %: 89 — ABNORMAL HIGH

## 2011-03-05 LAB — CBC
HCT: 37.7 — ABNORMAL LOW
MCHC: 34.3
MCV: 87.2
Platelets: 205
RDW: 15 — ABNORMAL HIGH

## 2011-03-05 LAB — SEDIMENTATION RATE: Sed Rate: 44 — ABNORMAL HIGH

## 2011-11-12 ENCOUNTER — Other Ambulatory Visit: Payer: Self-pay | Admitting: Neurosurgery

## 2011-12-08 ENCOUNTER — Encounter (HOSPITAL_COMMUNITY): Payer: Self-pay | Admitting: Respiratory Therapy

## 2011-12-16 ENCOUNTER — Encounter (HOSPITAL_COMMUNITY): Payer: Self-pay

## 2011-12-16 ENCOUNTER — Ambulatory Visit (HOSPITAL_COMMUNITY)
Admission: RE | Admit: 2011-12-16 | Discharge: 2011-12-16 | Disposition: A | Discharge status: Home / Self Care | Payer: Medicare Other | Source: Ambulatory Visit | Attending: Neurosurgery | Admitting: Neurosurgery

## 2011-12-16 ENCOUNTER — Encounter (HOSPITAL_COMMUNITY)
Admission: RE | Admit: 2011-12-16 | Discharge: 2011-12-16 | Disposition: A | Discharge status: Home / Self Care | Payer: Medicare Other | Source: Ambulatory Visit | Attending: Neurosurgery | Admitting: Neurosurgery

## 2011-12-16 DIAGNOSIS — M47814 Spondylosis without myelopathy or radiculopathy, thoracic region: Secondary | ICD-10-CM | POA: Insufficient documentation

## 2011-12-16 DIAGNOSIS — I10 Essential (primary) hypertension: Secondary | ICD-10-CM | POA: Insufficient documentation

## 2011-12-16 DIAGNOSIS — Z01812 Encounter for preprocedural laboratory examination: Secondary | ICD-10-CM | POA: Insufficient documentation

## 2011-12-16 DIAGNOSIS — G4730 Sleep apnea, unspecified: Secondary | ICD-10-CM | POA: Insufficient documentation

## 2011-12-16 DIAGNOSIS — Z01818 Encounter for other preprocedural examination: Secondary | ICD-10-CM | POA: Insufficient documentation

## 2011-12-16 DIAGNOSIS — F17200 Nicotine dependence, unspecified, uncomplicated: Secondary | ICD-10-CM | POA: Insufficient documentation

## 2011-12-16 HISTORY — DX: Sleep apnea, unspecified: G47.30

## 2011-12-16 HISTORY — DX: Unspecified osteoarthritis, unspecified site: M19.90

## 2011-12-16 HISTORY — DX: Essential (primary) hypertension: I10

## 2011-12-16 LAB — CBC
MCH: 28.6 pg (ref 26.0–34.0)
MCHC: 33.9 g/dL (ref 30.0–36.0)
MCV: 84.4 fL (ref 78.0–100.0)
Platelets: 146 10*3/uL — ABNORMAL LOW (ref 150–400)
RDW: 14.2 % (ref 11.5–15.5)

## 2011-12-16 LAB — SURGICAL PCR SCREEN: Staphylococcus aureus: NEGATIVE

## 2011-12-16 LAB — BASIC METABOLIC PANEL
BUN: 13 mg/dL (ref 6–23)
CO2: 29 mEq/L (ref 19–32)
Calcium: 9.6 mg/dL (ref 8.4–10.5)
Creatinine, Ser: 0.85 mg/dL (ref 0.50–1.35)
Glucose, Bld: 111 mg/dL — ABNORMAL HIGH (ref 70–99)

## 2011-12-16 NOTE — Pre-Procedure Instructions (Signed)
20 [name]  12/16/2011   Your procedure is scheduled on:  12/23/11  Report to [facility] at 530 AM.  Call this number if you have problems the morning of surgery: [phone]   Remember:   Do not eat food:After Midnight.  May have clear liquids:until Midnight .  Clear liquids include soda, tea, black coffee, apple or grape juice, broth.  Take these medicines the morning of surgery with A SIP OF WATER: *prilosec   Do not wear jewelry, make-up or nail polish.  Do not wear lotions, powders, or perfumes. You may wear deodorant.  Do not shave 48 hours prior to surgery. Men may shave face and neck.  Do not bring valuables to the hospital.  Contacts, dentures or bridgework may not be worn into surgery.  Leave suitcase in the car. After surgery it may be brought to your room.  For patients admitted to the hospital, checkout time is 11:00 AM the day of discharge.   Patients discharged the day of surgery will not be allowed to drive home.  Name and phone number of your driver: family  Special Instructions: CHG Shower Use Special Wash: 1/2 bottle night before surgery and 1/2 bottle morning of surgery.   Please read over the following fact sheets that you were given: Pain Booklet, Coughing and Deep Breathing, MRSA Information and Surgical Site Infection Prevention

## 2011-12-16 NOTE — Progress Notes (Signed)
Primary Physician - Dr. Chestine Spore with [facility] Does not see a cardiologist.

## 2011-12-22 MED ORDER — DEXTROSE 5 % IV SOLN
3.0000 g | INTRAVENOUS | Status: DC
  Filled 2011-12-22: qty 3000

## 2011-12-22 NOTE — H&P (Signed)
NEUROSURGICAL CONSULTATION  Maximum Reiland  #[id number]  DOB:  13-Nov-1955     November 12, 2011   HISTORY OF PRESENT ILLNESS:  [name] comes in today.  He is currently 56 years old and disabled.  He had a spinal cord stimulator placed on 01/10/2003 and a pain pump placed on 05/06/2005 both by me.  Four years ago he had a left lower leg surgery and left total knee replacement.  He has had multiple left legs surgeries for which he has been disabled.  He met with me and met with the [employer] representative who assessed his devices.  He is currently receiving Dilaudid, Bupivacaine and Fentanyl through Dr. Roxy Cedar office.  He has 2 months until he needs a refill which is scheduled for refill 9/8.  The current voltage left in his spinal cord stimulator is 2.6 volts.  He finds the devices are very helpful and wants to keep them in use.  Currently, Mr. Prieur is doing well otherwise.  He wants to go ahead with revision of the spinal cord stimulator also implantable pump.  We decided it made better sense to replace the pump and transfer the medication at least one month out from the refill date to avoid risk of infection.  Additionally, he is not sure whether he wants to go with the rechargeable stimulator or implantable pulse generator or to use a non-rechargeable device.  He is going to speak with the engineers from [employer] to decide how he wants to proceed with that and we plan on doing both revisions at the same time.  He has had no issues with his pump in terms of flow and it has worked well.  His pump is in the left lower abdomen.  His spinal cord stimulator implantable pulse generator is in the right lower back.  Both of these appear to be in good shape with well healed incisions and no tenderness.    He is otherwise stable medically.    REVIEW OF SYSTEMS:   Review of Systems was reviewed with the patient.  Pertinent positives include Eyes - he wears glasses; Cardiovascular - high blood pressure;  Musculoskeletal - arthritis.    PAST MEDICAL HISTORY:      Current Medical Conditions:  He has a history of high blood pressure    Prior Operations and Hospitalizations:  Spinal cord stimulator.      Height and Weight:  Currently he is 5', 11" tall and 300 lbs.    FAMILY HISTORY:    Mother is age 73 with a history of atrial fibrillation.    SOCIAL HISTORY:    He denies tobacco, alcohol or drug use.    IMPRESSION AND RECOMMENDATIONS:   After discussion with the patient, he does wish to proceed with revision.  He would need to have his left abdomen pump refill replaced and be turned, so that we can gain access to his IPG site.  He wishes to do this in early August and is going to decide how he wishes to proceed with the stimulator before then.  I spent 45 minutes in direct patient care and reviewing options with the patient and he wishes to proceed.  Risks and benefits were discussed.    [facility]    Danae Orleans. Venetia Maxon, M.D.  JDS:sv cc: Dr. Charmayne Sheer

## 2011-12-23 ENCOUNTER — Encounter (HOSPITAL_COMMUNITY): Payer: Self-pay | Admitting: Anesthesiology

## 2011-12-23 ENCOUNTER — Encounter (HOSPITAL_COMMUNITY)
Admission: RE | Disposition: A | Discharge status: Home / Self Care | Payer: Self-pay | Source: Ambulatory Visit | Attending: Neurosurgery

## 2011-12-23 ENCOUNTER — Ambulatory Visit (HOSPITAL_COMMUNITY): Payer: Medicare Other | Admitting: Anesthesiology

## 2011-12-23 ENCOUNTER — Ambulatory Visit (HOSPITAL_COMMUNITY)
Admission: RE | Admit: 2011-12-23 | Discharge: 2011-12-23 | Disposition: A | Discharge status: Home / Self Care | Payer: Medicare Other | Source: Ambulatory Visit | Attending: Neurosurgery | Admitting: Neurosurgery

## 2011-12-23 DIAGNOSIS — I10 Essential (primary) hypertension: Secondary | ICD-10-CM | POA: Insufficient documentation

## 2011-12-23 DIAGNOSIS — G8250 Quadriplegia, unspecified: Secondary | ICD-10-CM | POA: Insufficient documentation

## 2011-12-23 DIAGNOSIS — Z462 Encounter for fitting and adjustment of other devices related to nervous system and special senses: Secondary | ICD-10-CM | POA: Insufficient documentation

## 2011-12-23 DIAGNOSIS — G4730 Sleep apnea, unspecified: Secondary | ICD-10-CM | POA: Insufficient documentation

## 2011-12-23 HISTORY — PX: SPINAL CORD STIMULATOR INSERTION: SHX5378

## 2011-12-23 LAB — GLUCOSE, CAPILLARY
Glucose-Capillary: 97 mg/dL (ref 70–99)
Glucose-Capillary: 90 mg/dL (ref 70–99)

## 2011-12-23 SURGERY — INSERTION, SPINAL CORD STIMULATOR, LUMBAR
Anesthesia: Monitor Anesthesia Care | Site: Back | Wound class: Clean

## 2011-12-23 MED ORDER — SODIUM CHLORIDE 0.9 % IJ SOLN: 3.0000 mL | INTRAMUSCULAR | Status: DC | PRN

## 2011-12-23 MED ORDER — DOCUSATE SODIUM 100 MG PO CAPS: 100.0000 mg | ORAL_CAPSULE | Freq: Two times a day (BID) | ORAL | Status: DC

## 2011-12-23 MED ORDER — ACETAMINOPHEN 325 MG PO TABS: 650.0000 mg | ORAL_TABLET | ORAL | Status: DC | PRN

## 2011-12-23 MED ORDER — ONDANSETRON HCL 4 MG/2ML IJ SOLN: 4.0000 mg | INTRAMUSCULAR | Status: DC | PRN

## 2011-12-23 MED ORDER — VANCOMYCIN HCL 1000 MG IV SOLR
INTRAVENOUS | Status: DC | PRN
  Administered 2011-12-23: 1000 mg

## 2011-12-23 MED ORDER — SODIUM CHLORIDE 0.9 % IJ SOLN: 3.0000 mL | Freq: Two times a day (BID) | INTRAMUSCULAR | Status: DC

## 2011-12-23 MED ORDER — VANCOMYCIN HCL 1000 MG IV SOLR
INTRAVENOUS | Status: AC
  Filled 2011-12-23: qty 1000

## 2011-12-23 MED ORDER — BUPIVACAINE HCL (PF) 0.25 % IJ SOLN
INTRAMUSCULAR | Status: DC | PRN
  Administered 2011-12-23 (×2): 20 mL

## 2011-12-23 MED ORDER — MIDAZOLAM HCL 5 MG/5ML IJ SOLN
INTRAMUSCULAR | Status: DC | PRN
  Administered 2011-12-23 (×2): 2 mg via INTRAVENOUS

## 2011-12-23 MED ORDER — KCL IN DEXTROSE-NACL 20-5-0.45 MEQ/L-%-% IV SOLN
INTRAVENOUS | Status: DC
  Filled 2011-12-23: qty 1000

## 2011-12-23 MED ORDER — OXYCODONE-ACETAMINOPHEN 5-325 MG PO TABS: 1.0000 | ORAL_TABLET | ORAL | Status: DC | PRN

## 2011-12-23 MED ORDER — 0.9 % SODIUM CHLORIDE (POUR BTL) OPTIME
TOPICAL | Status: DC | PRN
  Administered 2011-12-23: 1000 mL

## 2011-12-23 MED ORDER — VANCOMYCIN HCL 1000 MG IV SOLR
1000.0000 mg | INTRAVENOUS | Status: DC | PRN
  Administered 2011-12-23: 1000 mg via INTRAVENOUS

## 2011-12-23 MED ORDER — HYDROCODONE-ACETAMINOPHEN 5-325 MG PO TABS: 1.0000 | ORAL_TABLET | ORAL | Status: DC | PRN

## 2011-12-23 MED ORDER — ONDANSETRON HCL 4 MG/2ML IJ SOLN
INTRAMUSCULAR | Status: DC | PRN
  Administered 2011-12-23: 4 mg via INTRAVENOUS

## 2011-12-23 MED ORDER — LIDOCAINE HCL (CARDIAC) 20 MG/ML IV SOLN
INTRAVENOUS | Status: DC | PRN
  Administered 2011-12-23: 100 mg via INTRAVENOUS

## 2011-12-23 MED ORDER — VANCOMYCIN HCL IN DEXTROSE 1-5 GM/200ML-% IV SOLN
INTRAVENOUS | Status: AC
  Filled 2011-12-23: qty 200

## 2011-12-23 MED ORDER — LIDOCAINE-EPINEPHRINE 1 %-1:100000 IJ SOLN
INTRAMUSCULAR | Status: DC | PRN
  Administered 2011-12-23 (×2): 20 mL

## 2011-12-23 MED ORDER — CEFAZOLIN SODIUM 1-5 GM-% IV SOLN
1.0000 g | Freq: Three times a day (TID) | INTRAVENOUS | Status: DC
  Filled 2011-12-23: qty 50

## 2011-12-23 MED ORDER — PROPOFOL 10 MG/ML IV EMUL
INTRAVENOUS | Status: DC | PRN
  Administered 2011-12-23: 30 mg via INTRAVENOUS
  Administered 2011-12-23: 40 mg via INTRAVENOUS
  Administered 2011-12-23: 50 mg via INTRAVENOUS
  Administered 2011-12-23 (×2): 40 mg via INTRAVENOUS

## 2011-12-23 MED ORDER — PANTOPRAZOLE SODIUM 40 MG PO TBEC: 40.0000 mg | DELAYED_RELEASE_TABLET | Freq: Every day | ORAL | Status: DC

## 2011-12-23 MED ORDER — CEFAZOLIN SODIUM 1-5 GM-% IV SOLN
INTRAVENOUS | Status: DC | PRN
  Administered 2011-12-23: 3 g via INTRAVENOUS

## 2011-12-23 MED ORDER — LISINOPRIL-HYDROCHLOROTHIAZIDE 20-12.5 MG PO TABS
2.0000 | ORAL_TABLET | Freq: Every day | ORAL | Status: DC
Start: 2011-12-23 — End: 2011-12-23

## 2011-12-23 MED ORDER — ALUM & MAG HYDROXIDE-SIMETH 200-200-20 MG/5ML PO SUSP: 30.0000 mL | Freq: Four times a day (QID) | ORAL | Status: DC | PRN

## 2011-12-23 MED ORDER — FENTANYL CITRATE 0.05 MG/ML IJ SOLN
INTRAMUSCULAR | Status: DC | PRN
  Administered 2011-12-23 (×3): 100 ug via INTRAVENOUS
  Administered 2011-12-23 (×4): 50 ug via INTRAVENOUS

## 2011-12-23 MED ORDER — LACTATED RINGERS IV SOLN
INTRAVENOUS | Status: DC | PRN
  Administered 2011-12-23 (×2): via INTRAVENOUS

## 2011-12-23 MED ORDER — HYDROCHLOROTHIAZIDE 25 MG PO TABS
25.0000 mg | ORAL_TABLET | Freq: Every day | ORAL | Status: DC
  Filled 2011-12-23: qty 1

## 2011-12-23 MED ORDER — ACETAMINOPHEN 650 MG RE SUPP: 650.0000 mg | RECTAL | Status: DC | PRN

## 2011-12-23 MED ORDER — LISINOPRIL 40 MG PO TABS
40.0000 mg | ORAL_TABLET | Freq: Every day | ORAL | Status: DC
  Filled 2011-12-23: qty 1

## 2011-12-23 MED ORDER — PHENOL 1.4 % MT LIQD: 1.0000 | OROMUCOSAL | Status: DC | PRN

## 2011-12-23 MED ORDER — MENTHOL 3 MG MT LOZG: 1.0000 | LOZENGE | OROMUCOSAL | Status: DC | PRN

## 2011-12-23 SURGICAL SUPPLY — 69 items
BAG DECANTER FOR FLEXI CONT (MISCELLANEOUS) IMPLANT
BATTERY PRIME ADVANCED INS (Orthopedic Implant) ×2 IMPLANT
BENZOIN TINCTURE PRP APPL 2/3 (GAUZE/BANDAGES/DRESSINGS) ×4 IMPLANT
BLADE SURG ROTATE 9660 (MISCELLANEOUS) IMPLANT
BRUSH SCRUB EZ PLAIN DRY (MISCELLANEOUS) ×2 IMPLANT
BUR MATCHSTICK NEURO 3.0 LAGG (BURR) IMPLANT
BUR PRECISION FLUTE 5.0 (BURR) IMPLANT
CLOTH BEACON ORANGE TIMEOUT ST (SAFETY) ×2 IMPLANT
CONT SPEC 4OZ CLIKSEAL STRL BL (MISCELLANEOUS) IMPLANT
CORDS BIPOLAR (ELECTRODE) ×2 IMPLANT
DERMABOND ADVANCED (GAUZE/BANDAGES/DRESSINGS)
DERMABOND ADVANCED .7 DNX12 (GAUZE/BANDAGES/DRESSINGS) IMPLANT
DRAPE C-ARM 42X72 X-RAY (DRAPES) IMPLANT
DRAPE CAMERA VIDEO/LASER (DRAPES) ×2 IMPLANT
DRAPE INCISE IOBAN 85X60 (DRAPES) ×2 IMPLANT
DRAPE LAPAROTOMY 100X72X124 (DRAPES) ×4 IMPLANT
DRAPE POUCH INSTRU U-SHP 10X18 (DRAPES) ×2 IMPLANT
DRAPE SURG 17X23 STRL (DRAPES) IMPLANT
DRESSING TELFA 8X3 (GAUZE/BANDAGES/DRESSINGS) IMPLANT
ELECT CAUTERY BLADE 6.4 (BLADE) ×2 IMPLANT
ELECT REM PT RETURN 9FT ADLT (ELECTROSURGICAL) ×2
ELECTRODE REM PT RTRN 9FT ADLT (ELECTROSURGICAL) ×1 IMPLANT
GAUZE SPONGE 4X4 16PLY XRAY LF (GAUZE/BANDAGES/DRESSINGS) IMPLANT
GLOVE BIO SURGEON STRL SZ8 (GLOVE) ×6 IMPLANT
GLOVE BIOGEL PI IND STRL 8 (GLOVE) ×2 IMPLANT
GLOVE BIOGEL PI IND STRL 8.5 (GLOVE) ×2 IMPLANT
GLOVE BIOGEL PI INDICATOR 8 (GLOVE) ×2
GLOVE BIOGEL PI INDICATOR 8.5 (GLOVE) ×2
GLOVE ECLIPSE 7.5 STRL STRAW (GLOVE) ×4 IMPLANT
GLOVE EXAM NITRILE LRG STRL (GLOVE) IMPLANT
GLOVE EXAM NITRILE MD LF STRL (GLOVE) ×4 IMPLANT
GLOVE EXAM NITRILE XL STR (GLOVE) IMPLANT
GLOVE EXAM NITRILE XS STR PU (GLOVE) IMPLANT
GOWN BRE IMP SLV AUR LG STRL (GOWN DISPOSABLE) IMPLANT
GOWN BRE IMP SLV AUR XL STRL (GOWN DISPOSABLE) ×6 IMPLANT
GOWN STRL REIN 2XL LVL4 (GOWN DISPOSABLE) ×4 IMPLANT
KIT BASIN OR (CUSTOM PROCEDURE TRAY) ×2 IMPLANT
KIT REFILL (MISCELLANEOUS) ×1
KIT REFILL CATH SYNCHROMED II (MISCELLANEOUS) ×1 IMPLANT
KIT ROOM TURNOVER OR (KITS) ×2 IMPLANT
NEEDLE HYPO 25X1 1.5 SAFETY (NEEDLE) ×4 IMPLANT
NS IRRIG 1000ML POUR BTL (IV SOLUTION) ×2 IMPLANT
PACK LAMINECTOMY NEURO (CUSTOM PROCEDURE TRAY) ×2 IMPLANT
PAD ARMBOARD 7.5X6 YLW CONV (MISCELLANEOUS) ×6 IMPLANT
PATIENT PROGRAMMER ×2 IMPLANT
PENCIL BUTTON HOLSTER BLD 10FT (ELECTRODE) ×2 IMPLANT
PERSONAL THERAPY MANAGER ×2 IMPLANT
PROGRAMMER ANTENNA EXT (UROLOGICAL SUPPLIES) ×2 IMPLANT
Pocket Adaptor for Spinal Cord Stimulation ×2 IMPLANT
SPONGE GAUZE 4X4 12PLY (GAUZE/BANDAGES/DRESSINGS) ×4 IMPLANT
SPONGE LAP 4X18 X RAY DECT (DISPOSABLE) IMPLANT
SPONGE SURGIFOAM ABS GEL SZ50 (HEMOSTASIS) IMPLANT
STAPLER SKIN PROX WIDE 3.9 (STAPLE) ×4 IMPLANT
STRIP CLOSURE SKIN 1/2X4 (GAUZE/BANDAGES/DRESSINGS) ×4 IMPLANT
SUT SILK 0 TIES 10X30 (SUTURE) ×2 IMPLANT
SUT SILK 2 0 FS (SUTURE) ×6 IMPLANT
SUT SILK 2 0 TIES 10X30 (SUTURE) ×2 IMPLANT
SUT VIC AB 0 CT1 18XCR BRD8 (SUTURE) ×1 IMPLANT
SUT VIC AB 0 CT1 8-18 (SUTURE) ×1
SUT VIC AB 2-0 CP2 18 (SUTURE) ×4 IMPLANT
SUT VIC AB 3-0 SH 8-18 (SUTURE) ×6 IMPLANT
SYR 20CC LL (SYRINGE) ×2 IMPLANT
SYR 3ML LL SCALE MARK (SYRINGE) ×2 IMPLANT
Synchromed II ×2 IMPLANT
TAPE CLOTH SURG 4X10 WHT LF (GAUZE/BANDAGES/DRESSINGS) ×4 IMPLANT
TOWEL OR 17X24 6PK STRL BLUE (TOWEL DISPOSABLE) ×2 IMPLANT
TOWEL OR 17X26 10 PK STRL BLUE (TOWEL DISPOSABLE) ×4 IMPLANT
TUBE CONNECTING 12X1/4 (SUCTIONS) ×2 IMPLANT
WATER STERILE IRR 1000ML POUR (IV SOLUTION) ×2 IMPLANT

## 2011-12-23 NOTE — Progress Notes (Signed)
Pt. Alert and oriented,follows simple instructions, denies pain. Incision area without swelling, redness or S/S of infection. Voiding adequate clear yellow urine. Moving all extremities well and vitals stable and documented. Spinal Stimulator surgery notes instructions given to patient and family member for home safety and precautions.Pt and family stated understanding of instructions given.

## 2011-12-23 NOTE — Anesthesia Postprocedure Evaluation (Signed)
  Anesthesia Post-op Note  Patient: [name]  Procedure(s) Performed: Procedure(s) (LRB): LUMBAR SPINAL CORD STIMULATOR INSERTION (N/A)  Patient Location: PACU  Anesthesia Type: MAC  Level of Consciousness: awake, alert  and oriented  Airway and Oxygen Therapy: Patient Spontanous Breathing  Post-op Pain: none  Post-op Assessment: Post-op Vital signs reviewed, Patient's Cardiovascular Status Stable, Respiratory Function Stable, Patent Airway, No signs of Nausea or vomiting and Pain level controlled  Post-op Vital Signs: Reviewed and stable  Complications: No apparent anesthesia complications

## 2011-12-23 NOTE — Anesthesia Preprocedure Evaluation (Addendum)
Anesthesia Evaluation  Patient identified by MRN, date of birth, ID band Patient awake    Reviewed: Allergy & Precautions, H&P , NPO status , Patient's Chart, lab work & pertinent test results  History of Anesthesia Complications Negative for: history of anesthetic complications  Airway Mallampati: II TM Distance: >3 FB Neck ROM: Full    Dental  (+) Poor Dentition and Dental Advisory Given   Pulmonary sleep apnea and Continuous Positive Airway Pressure Ventilation , former smoker (quit a year),  breath sounds clear to auscultation  Pulmonary exam normal       Cardiovascular hypertension, Pt. on medications Rhythm:Regular Rate:Normal     Neuro/Psych Chronic lumbar pain s/p spinal cord stimulator and pain pump     GI/Hepatic negative GI ROS, Neg liver ROS,   Endo/Other  Well Controlled, Type obesityDiet controlled elevated A1C  Renal/GU negative Renal ROS     Musculoskeletal   Abdominal (+) + obese,   Peds  Hematology   Anesthesia Other Findings   Reproductive/Obstetrics                         Anesthesia Physical Anesthesia Plan  ASA: III  Anesthesia Plan: MAC   Post-op Pain Management:    Induction:   Airway Management Planned: Simple Face Mask  Additional Equipment:   Intra-op Plan:   Post-operative Plan:   Informed Consent: I have reviewed the patients History and Physical, chart, labs and discussed the procedure including the risks, benefits and alternatives for the proposed anesthesia with the patient or authorized representative who has indicated his/her understanding and acceptance.   Dental advisory given  Plan Discussed with: Anesthesiologist, CRNA and Surgeon  Anesthesia Plan Comments: (Plan routine monitors, MAC)       Anesthesia Quick Evaluation

## 2011-12-23 NOTE — Interval H&P Note (Signed)
History and Physical Interval Note:  12/23/2011 7:36 AM  [name]  has presented today for surgery, with the diagnosis of Depleted Implantable pulse generator and spinal cord stimulator  The various methods of treatment have been discussed with the patient and family. After consideration of risks, benefits and other options for treatment, the patient has consented to  Procedure(s) (LRB): LUMBAR SPINAL CORD STIMULATOR INSERTION (N/A) as a surgical intervention .  The patient's history has been reviewed, patient examined, no change in status, stable for surgery.  I have reviewed the patient's chart and labs.  Questions were answered to the patient's satisfaction.     [name] D  Date of Initial H&P: 12/22/2011  History reviewed, patient examined, no change in status, stable for surgery.

## 2011-12-23 NOTE — Transfer of Care (Signed)
Immediate Anesthesia Transfer of Care Note  Patient: [name]  Procedure(s) Performed: Procedure(s) (LRB): LUMBAR SPINAL CORD STIMULATOR INSERTION (N/A)  Patient Location: PACU  Anesthesia Type: MAC  Level of Consciousness: awake, alert , oriented and patient cooperative  Airway & Oxygen Therapy: Patient Spontanous Breathing  Post-op Assessment: Report given to PACU RN, Post -op Vital signs reviewed and stable and Patient moving all extremities  Post vital signs: Reviewed and stable  Complications: No apparent anesthesia complications

## 2011-12-23 NOTE — Op Note (Signed)
12/23/2011  9:21 AM  PATIENT:  [name]  56 y.o. male  PRE-OPERATIVE DIAGNOSIS:  Depleted Implantable pulse generator and spinal cord stimulator and depleted implantable pump with chronic left leg pain  POST-OPERATIVE DIAGNOSIS:  Depleted Implantable pulse generator and spinal cord stimulator and depleted implantable pump with chronic left leg pain  PROCEDURE:  Procedure(s) (LRB): LUMBAR SPINAL CORD STIMULATOR INSERTION (N/A) and Revision of implantable intrathecal pain pump  SURGEON:  Surgeon(s) and Role:    * Maeola Harman, MD - Primary  PHYSICIAN ASSISTANT:   ASSISTANTS: Poteat, RN   ANESTHESIA:   local with MAC  EBL:  Total I/O In: 1000 [I.V.:1000] Out: -   BLOOD ADMINISTERED:none  DRAINS: none   LOCAL MEDICATIONS USED:  LIDOCAINE   SPECIMEN:  No Specimen  DISPOSITION OF SPECIMEN:  N/A  COUNTS:  YES  TOURNIQUET:  * No tourniquets in log *  DICTATION: DICTATION: Patient has implanted baclofen pump for 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  It was elected for patient to undergo baclofen pump revision.  PROCEDURE: Patient was brought to the operating room and given intravenous sedation.  Left upper quadrant was prepped with betadine scrub and paint.  Area of planned incision was infiltrated with marcaine.  Prior incision was reopened and the old pain pump was externalized.  Catheter was disconnected. There was spontaneous flow of CSF.  The new pain pump was filled and connected to the catheter.  3, 2-0 silk sutures were placed in the pocket. The new pump was then internalized. Wound was irrigated with vancomycin. Then irrigated once more.  Incision was closed with 2-0 Vicryl and 3-0 vicryl sutures and dressed with a sterile occlusive dressing. Patient was then repositioned on his left side to expose his right low back and posterior iliac region. Previous incision was infiltrated with lidocaine and reopened. The implantable pulse generator was  placed along with pocket adapter. Connections were torqued appropriately. Redundant electrode was circularized beneath the IPG. The wound was irrigated and closed with 20 and 3-0 Vicryl stitches and the wounds were dressed with benzoin and steristrips. Impedances were assessed and found to be correct. Patient was taken to recovery having tolerated procedure well counts were correct at the end of the case.   PLAN OF CARE: Admit for overnight observation  PATIENT DISPOSITION:  PACU - hemodynamically stable.   Delay start of Pharmacological VTE agent (>24hrs) due to surgical blood loss or risk of bleeding: yes

## 2011-12-23 NOTE — Plan of Care (Signed)
Problem: Consults Goal: Diagnosis - Spinal Surgery Outcome: Completed/Met Date Met:  12/23/11 LUMBAR SPINAL CORD STIMULATOR INSERTION

## 2011-12-24 ENCOUNTER — Encounter (HOSPITAL_COMMUNITY): Payer: Self-pay | Admitting: Neurosurgery

## 2012-12-14 ENCOUNTER — Other Ambulatory Visit: Payer: Self-pay | Admitting: Neurosurgery

## 2012-12-20 ENCOUNTER — Encounter (HOSPITAL_COMMUNITY): Payer: Self-pay | Admitting: Pharmacy Technician

## 2012-12-23 ENCOUNTER — Encounter (HOSPITAL_COMMUNITY)
Admission: RE | Admit: 2012-12-23 | Discharge: 2012-12-23 | Disposition: A | Discharge status: Home / Self Care | Payer: Medicare Other | Source: Ambulatory Visit | Attending: Anesthesiology | Admitting: Anesthesiology

## 2012-12-23 ENCOUNTER — Encounter (HOSPITAL_COMMUNITY)
Admission: RE | Admit: 2012-12-23 | Discharge: 2012-12-23 | Disposition: A | Discharge status: Home / Self Care | Payer: Medicare Other | Source: Ambulatory Visit | Attending: Neurosurgery | Admitting: Neurosurgery

## 2012-12-23 ENCOUNTER — Encounter (HOSPITAL_COMMUNITY): Payer: Self-pay

## 2012-12-23 HISTORY — DX: Gastro-esophageal reflux disease without esophagitis: K21.9

## 2012-12-23 HISTORY — DX: Dizziness and giddiness: R42

## 2012-12-23 LAB — BASIC METABOLIC PANEL
BUN: 12 mg/dL (ref 6–23)
CO2: 27 mEq/L (ref 19–32)
Chloride: 101 mEq/L (ref 96–112)
Creatinine, Ser: 0.85 mg/dL (ref 0.50–1.35)
GFR calc Af Amer: >90 mL/min (ref >90)
Potassium: 3.6 mEq/L (ref 3.5–5.1)

## 2012-12-23 LAB — CBC
HCT: 39.6 % (ref 39.0–52.0)
Hemoglobin: 13.5 g/dL (ref 13.0–17.0)
MCV: 85.2 fL (ref 78.0–100.0)
RBC: 4.65 MIL/uL (ref 4.22–5.81)
RDW: 14.8 % (ref 11.5–15.5)
WBC: 6.7 10*3/uL (ref 4.0–10.5)

## 2012-12-23 LAB — SURGICAL PCR SCREEN: Staphylococcus aureus: NEGATIVE

## 2012-12-23 NOTE — Pre-Procedure Instructions (Signed)
[name]  12/23/2012   Your procedure is scheduled on:  Tuesday, August 12th  Report to [facility] at 1230 PM.  Call this number if you have problems the morning of surgery: [phone]   Remember:   Do not eat food or drink liquids after midnight.   Take these medicines the morning of surgery with A SIP OF WATER: prilosec, oxycodone if needed Stop aspirin 5 days prior to surgery   Do not wear jewelry.  Do not wear lotions, powders, or perfumes. You may wear deodorant.  Do not shave 48 hours prior to surgery. Men may shave face and neck.  Do not bring valuables to the hospital.  [facility] is not responsible  for any belongings or valuables.  Contacts, dentures or bridgework may not be worn into surgery.  Leave suitcase in the car. After surgery it may be brought to your room.  For patients admitted to the hospital, checkout time is 11:00 AM the day of discharge.   Patients discharged the day of surgery will not be allowed to drive home.    Special Instructions: Shower using CHG 2 nights before surgery and the night before surgery.  If you shower the day of surgery use CHG.  Use special wash - you have one bottle of CHG for all showers.  You should use approximately 1/3 of the bottle for each shower.   Please read over the following fact sheets that you were given: Pain Booklet, Coughing and Deep Breathing and Surgical Site Infection Prevention

## 2012-12-23 NOTE — Progress Notes (Signed)
Primary physician - dr. Jerelyn Charles at [facility] Does not have a cardiologist ekg last in 2013 none since this time.

## 2012-12-24 MED ORDER — MORPHINE SULFATE 4 MG/ML IJ SOLN
INTRAMUSCULAR | Status: AC
  Filled 2012-12-24: qty 1

## 2012-12-28 ENCOUNTER — Ambulatory Visit (HOSPITAL_COMMUNITY): Payer: Medicare Other | Admitting: Anesthesiology

## 2012-12-28 ENCOUNTER — Encounter (HOSPITAL_COMMUNITY)
Admission: RE | Disposition: A | Discharge status: Home / Self Care | Payer: Self-pay | Source: Ambulatory Visit | Attending: Neurosurgery

## 2012-12-28 ENCOUNTER — Encounter (HOSPITAL_COMMUNITY): Payer: Self-pay | Admitting: Anesthesiology

## 2012-12-28 ENCOUNTER — Ambulatory Visit (HOSPITAL_COMMUNITY)
Admission: RE | Admit: 2012-12-28 | Discharge: 2012-12-28 | Disposition: A | Discharge status: Home / Self Care | Payer: Medicare Other | Source: Ambulatory Visit | Attending: Neurosurgery | Admitting: Neurosurgery

## 2012-12-28 ENCOUNTER — Ambulatory Visit (HOSPITAL_COMMUNITY): Payer: Medicare Other

## 2012-12-28 DIAGNOSIS — E119 Type 2 diabetes mellitus without complications: Secondary | ICD-10-CM | POA: Insufficient documentation

## 2012-12-28 DIAGNOSIS — Y831 Surgical operation with implant of artificial internal device as the cause of abnormal reaction of the patient, or of later complication, without mention of misadventure at the time of the procedure: Secondary | ICD-10-CM | POA: Insufficient documentation

## 2012-12-28 DIAGNOSIS — Z87891 Personal history of nicotine dependence: Secondary | ICD-10-CM | POA: Insufficient documentation

## 2012-12-28 DIAGNOSIS — Y929 Unspecified place or not applicable: Secondary | ICD-10-CM | POA: Insufficient documentation

## 2012-12-28 DIAGNOSIS — T85695A Other mechanical complication of other nervous system device, implant or graft, initial encounter: Secondary | ICD-10-CM | POA: Insufficient documentation

## 2012-12-28 DIAGNOSIS — Z79899 Other long term (current) drug therapy: Secondary | ICD-10-CM | POA: Insufficient documentation

## 2012-12-28 DIAGNOSIS — G4730 Sleep apnea, unspecified: Secondary | ICD-10-CM | POA: Insufficient documentation

## 2012-12-28 DIAGNOSIS — G8921 Chronic pain due to trauma: Secondary | ICD-10-CM | POA: Insufficient documentation

## 2012-12-28 DIAGNOSIS — I10 Essential (primary) hypertension: Secondary | ICD-10-CM | POA: Insufficient documentation

## 2012-12-28 DIAGNOSIS — K219 Gastro-esophageal reflux disease without esophagitis: Secondary | ICD-10-CM | POA: Insufficient documentation

## 2012-12-28 HISTORY — PX: PAIN PUMP REVISION: SHX6231

## 2012-12-28 SURGERY — PAIN PUMP REVISION
Anesthesia: General | Wound class: Clean

## 2012-12-28 MED ORDER — LACTATED RINGERS IV SOLN
INTRAVENOUS | Status: DC | PRN
  Administered 2012-12-28 (×2): via INTRAVENOUS

## 2012-12-28 MED ORDER — MIDAZOLAM HCL 5 MG/5ML IJ SOLN
INTRAMUSCULAR | Status: DC | PRN
  Administered 2012-12-28: 2 mg via INTRAVENOUS

## 2012-12-28 MED ORDER — PHENYLEPHRINE HCL 10 MG/ML IJ SOLN
INTRAMUSCULAR | Status: DC | PRN
  Administered 2012-12-28 (×3): 40 ug via INTRAVENOUS

## 2012-12-28 MED ORDER — SODIUM CHLORIDE 0.9 % IR SOLN
Status: DC | PRN
  Administered 2012-12-28: 17:00:00

## 2012-12-28 MED ORDER — FENTANYL CITRATE 0.05 MG/ML IJ SOLN
INTRAMUSCULAR | Status: DC | PRN
  Administered 2012-12-28: 100 ug via INTRAVENOUS
  Administered 2012-12-28 (×4): 50 ug via INTRAVENOUS

## 2012-12-28 MED ORDER — GLYCOPYRROLATE 0.2 MG/ML IJ SOLN
INTRAMUSCULAR | Status: DC | PRN
  Administered 2012-12-28: .8 mg via INTRAVENOUS

## 2012-12-28 MED ORDER — DEXTROSE 5 % IV SOLN
3.0000 g | INTRAVENOUS | Status: AC
  Administered 2012-12-28: 3 g via INTRAVENOUS
  Filled 2012-12-28: qty 3000

## 2012-12-28 MED ORDER — NEOSTIGMINE METHYLSULFATE 1 MG/ML IJ SOLN
INTRAMUSCULAR | Status: DC | PRN
  Administered 2012-12-28: 5 mg via INTRAVENOUS

## 2012-12-28 MED ORDER — IOHEXOL 300 MG/ML  SOLN
INTRAMUSCULAR | Status: DC | PRN
  Administered 2012-12-28: 50 mL via INTRAVENOUS

## 2012-12-28 MED ORDER — VECURONIUM BROMIDE 10 MG IV SOLR
INTRAVENOUS | Status: DC | PRN
  Administered 2012-12-28 (×2): 1 mg via INTRAVENOUS

## 2012-12-28 MED ORDER — OXYCODONE HCL 5 MG PO TABS: 5.0000 mg | ORAL_TABLET | Freq: Once | ORAL | Status: DC | PRN

## 2012-12-28 MED ORDER — OXYCODONE HCL 5 MG/5ML PO SOLN: 5.0000 mg | Freq: Once | ORAL | Status: DC | PRN

## 2012-12-28 MED ORDER — BUPIVACAINE-EPINEPHRINE PF 0.5-1:200000 % IJ SOLN
INTRAMUSCULAR | Status: DC | PRN
  Administered 2012-12-28: 5 mL

## 2012-12-28 MED ORDER — ONDANSETRON HCL 4 MG/2ML IJ SOLN: 4.0000 mg | Freq: Once | INTRAMUSCULAR | Status: DC | PRN

## 2012-12-28 MED ORDER — BACITRACIN 50000 UNITS IM SOLR
INTRAMUSCULAR | Status: AC
  Filled 2012-12-28: qty 1

## 2012-12-28 MED ORDER — 0.9 % SODIUM CHLORIDE (POUR BTL) OPTIME
TOPICAL | Status: DC | PRN
  Administered 2012-12-28: 1000 mL

## 2012-12-28 MED ORDER — ROCURONIUM BROMIDE 100 MG/10ML IV SOLN
INTRAVENOUS | Status: DC | PRN
  Administered 2012-12-28: 50 mg via INTRAVENOUS

## 2012-12-28 MED ORDER — PROPOFOL 10 MG/ML IV BOLUS
INTRAVENOUS | Status: DC | PRN
  Administered 2012-12-28: 40 mg via INTRAVENOUS
  Administered 2012-12-28: 200 mg via INTRAVENOUS

## 2012-12-28 MED ORDER — SODIUM CHLORIDE 0.9 % IV SOLN
INTRAVENOUS | Status: AC
  Filled 2012-12-28: qty 500

## 2012-12-28 MED ORDER — ONDANSETRON HCL 4 MG/2ML IJ SOLN
INTRAMUSCULAR | Status: DC | PRN
  Administered 2012-12-28: 4 mg via INTRAVENOUS

## 2012-12-28 MED ORDER — HYDROMORPHONE HCL PF 1 MG/ML IJ SOLN: 0.2500 mg | INTRAMUSCULAR | Status: DC | PRN

## 2012-12-28 MED ORDER — LACTATED RINGERS IV SOLN
INTRAVENOUS | Status: DC
  Administered 2012-12-28: 14:00:00 via INTRAVENOUS

## 2012-12-28 MED ORDER — LIDOCAINE HCL (CARDIAC) 20 MG/ML IV SOLN
INTRAVENOUS | Status: DC | PRN
  Administered 2012-12-28: 100 mg via INTRAVENOUS

## 2012-12-28 SURGICAL SUPPLY — 56 items
BAG DECANTER FOR FLEXI CONT (MISCELLANEOUS) ×2 IMPLANT
BINDER ABD UNIV 12 45-62 (WOUND CARE) IMPLANT
BINDER ABDOMINAL 46IN 62IN (WOUND CARE)
BLADE SURG ROTATE 9660 (MISCELLANEOUS) IMPLANT
BOOT SUTURE AID YELLOW STND (SUTURE) ×2 IMPLANT
CHLORAPREP W/TINT 26ML (MISCELLANEOUS) ×2 IMPLANT
CLOTH BEACON ORANGE TIMEOUT ST (SAFETY) ×2 IMPLANT
CONT SPEC 4OZ CLIKSEAL STRL BL (MISCELLANEOUS) ×4 IMPLANT
DERMABOND ADVANCED (GAUZE/BANDAGES/DRESSINGS)
DERMABOND ADVANCED .7 DNX12 (GAUZE/BANDAGES/DRESSINGS) IMPLANT
DRAPE LAPAROTOMY 100X72X124 (DRAPES) ×2 IMPLANT
DRAPE ORTHO SPLIT 77X108 STRL (DRAPES) ×2
DRAPE POUCH INSTRU U-SHP 10X18 (DRAPES) ×2 IMPLANT
DRAPE SURG 17X23 STRL (DRAPES) ×8 IMPLANT
DRAPE SURG ORHT 6 SPLT 77X108 (DRAPES) ×2 IMPLANT
DRESSING TELFA 8X3 (GAUZE/BANDAGES/DRESSINGS) IMPLANT
DRSG COVADERM 4X8 (GAUZE/BANDAGES/DRESSINGS) ×2 IMPLANT
DRSG OPSITE POSTOP 4X6 (GAUZE/BANDAGES/DRESSINGS) ×2 IMPLANT
ELECT REM PT RETURN 9FT ADLT (ELECTROSURGICAL) ×2
ELECTRODE REM PT RTRN 9FT ADLT (ELECTROSURGICAL) ×1 IMPLANT
GAUZE SPONGE 4X4 16PLY XRAY LF (GAUZE/BANDAGES/DRESSINGS) IMPLANT
GLOVE BIOGEL PI IND STRL 6 (GLOVE) ×1 IMPLANT
GLOVE BIOGEL PI INDICATOR 6 (GLOVE) ×1
GLOVE ECLIPSE 7.5 STRL STRAW (GLOVE) ×2 IMPLANT
GLOVE EXAM NITRILE LRG STRL (GLOVE) ×2 IMPLANT
GLOVE EXAM NITRILE MD LF STRL (GLOVE) IMPLANT
GLOVE EXAM NITRILE XL STR (GLOVE) IMPLANT
GLOVE EXAM NITRILE XS STR PU (GLOVE) IMPLANT
GLOVE INDICATOR 6.5 STRL GRN (GLOVE) ×4 IMPLANT
GOWN BRE IMP SLV AUR LG STRL (GOWN DISPOSABLE) IMPLANT
GOWN BRE IMP SLV AUR XL STRL (GOWN DISPOSABLE) ×2 IMPLANT
GOWN STRL REIN 2XL LVL4 (GOWN DISPOSABLE) IMPLANT
KIT BASIN OR (CUSTOM PROCEDURE TRAY) ×2 IMPLANT
KIT CATHETER ACCESS PORT (KITS) ×2 IMPLANT
KIT INTRATHECAL CATH PUMP (KITS) ×2 IMPLANT
KIT REFILL (MISCELLANEOUS) ×1
KIT REFILL CATH SYNCHROMED II (MISCELLANEOUS) ×1 IMPLANT
KIT ROOM TURNOVER OR (KITS) ×2 IMPLANT
MARKER SKIN DUAL TIP RULER LAB (MISCELLANEOUS) ×2 IMPLANT
NEEDLE 18GX1X1/2 (RX/OR ONLY) (NEEDLE) IMPLANT
NEEDLE HYPO 25X1 1.5 SAFETY (NEEDLE) ×2 IMPLANT
NS IRRIG 1000ML POUR BTL (IV SOLUTION) ×2 IMPLANT
PACK LAMINECTOMY NEURO (CUSTOM PROCEDURE TRAY) ×2 IMPLANT
PAD ARMBOARD 7.5X6 YLW CONV (MISCELLANEOUS) ×2 IMPLANT
SPONGE LAP 4X18 X RAY DECT (DISPOSABLE) IMPLANT
SPONGE SURGIFOAM ABS GEL SZ50 (HEMOSTASIS) IMPLANT
STAPLER SKIN PROX WIDE 3.9 (STAPLE) ×2 IMPLANT
SUT MNCRL AB 4-0 PS2 18 (SUTURE) ×2 IMPLANT
SUT SILK 2 0 FS (SUTURE) ×6 IMPLANT
SUT VIC AB 2-0 CP2 18 (SUTURE) ×4 IMPLANT
SYR 20CC LL (SYRINGE) ×4 IMPLANT
SYR 3ML LL SCALE MARK (SYRINGE) ×2 IMPLANT
TOWEL OR 17X24 6PK STRL BLUE (TOWEL DISPOSABLE) ×2 IMPLANT
TOWEL OR 17X26 10 PK STRL BLUE (TOWEL DISPOSABLE) ×2 IMPLANT
WATER STERILE IRR 1000ML POUR (IV SOLUTION) ×2 IMPLANT
YANKAUER SUCT BULB TIP NO VENT (SUCTIONS) ×2 IMPLANT

## 2012-12-28 NOTE — Anesthesia Preprocedure Evaluation (Addendum)
Anesthesia Evaluation  Patient identified by MRN, date of birth, ID band Patient awake    Reviewed: Allergy & Precautions, H&P , NPO status , Patient's Chart, lab work & pertinent test results  History of Anesthesia Complications Negative for: history of anesthetic complications  Airway Mallampati: II TM Distance: >3 FB Neck ROM: Full    Dental  (+) Teeth Intact and Dental Advisory Given   Pulmonary sleep apnea , former smoker,  bipap breath sounds clear to auscultation        Cardiovascular hypertension, Pt. on medications Rhythm:Regular Rate:Normal     Neuro/Psych  Neuromuscular disease    GI/Hepatic Neg liver ROS, GERD-  Medicated and Controlled,  Endo/Other  diabetesDiet controlled  Renal/GU negative Renal ROS     Musculoskeletal negative musculoskeletal ROS (+)   Abdominal   Peds  Hematology   Anesthesia Other Findings   Reproductive/Obstetrics negative OB ROS                         Anesthesia Physical Anesthesia Plan  ASA: II  Anesthesia Plan: General   Post-op Pain Management:    Induction: Intravenous  Airway Management Planned: Oral ETT  Additional Equipment:   Intra-op Plan:   Post-operative Plan: Extubation in OR  Informed Consent: I have reviewed the patients History and Physical, chart, labs and discussed the procedure including the risks, benefits and alternatives for the proposed anesthesia with the patient or authorized representative who has indicated his/her understanding and acceptance.   Dental advisory given  Plan Discussed with: CRNA, Anesthesiologist and Surgeon  Anesthesia Plan Comments:         Anesthesia Quick Evaluation

## 2012-12-28 NOTE — Preoperative (Signed)
Beta Blockers   Reason not to administer Beta Blockers:Not Applicable 

## 2012-12-28 NOTE — Anesthesia Postprocedure Evaluation (Signed)
  Anesthesia Post-op Note  Patient: [name]  Procedure(s) Performed: Procedure(s) with comments: intrathecal Baclofen pump exploration with revision of catheter (N/A) - Baclofen pump exploration with Dr. Ollen Bowl to assist  Patient Location: PACU  Anesthesia Type:General  Level of Consciousness: awake, alert  and oriented  Airway and Oxygen Therapy: Patient Spontanous Breathing  Post-op Pain: none  Post-op Assessment: Post-op Vital signs reviewed  Post-op Vital Signs: Reviewed  Complications: No apparent anesthesia complications

## 2012-12-28 NOTE — Anesthesia Procedure Notes (Signed)
Procedure Name: Intubation Date/Time: 12/28/2012 4:04 PM Performed by: Lovie Chol Pre-anesthesia Checklist: Patient identified, Emergency Drugs available, Patient being monitored, Suction available and Timeout performed Patient Re-evaluated:Patient Re-evaluated prior to inductionOxygen Delivery Method: Circle system utilized Preoxygenation: Pre-oxygenation with 100% oxygen Intubation Type: IV induction Ventilation: Mask ventilation without difficulty and Oral airway inserted - appropriate to patient size Laryngoscope Size: Miller and 3 Grade View: Grade II Tube type: Oral Tube size: 7.5 mm Airway Equipment and Method: Stylet Placement Confirmation: ETT inserted through vocal cords under direct vision,  positive ETCO2,  CO2 detector and breath sounds checked- equal and bilateral Secured at: 22 cm Tube secured with: Tape Dental Injury: Teeth and Oropharynx as per pre-operative assessment

## 2012-12-28 NOTE — Transfer of Care (Signed)
Immediate Anesthesia Transfer of Care Note  Patient: [name]  Procedure(s) Performed: Procedure(s) with comments: intrathecal Baclofen pump exploration with revision of catheter (N/A) - Baclofen pump exploration with Dr. Ollen Bowl to assist  Patient Location: PACU  Anesthesia Type:General  Level of Consciousness: awake, alert  and oriented  Airway & Oxygen Therapy: Patient Spontanous Breathing and Patient connected to nasal cannula oxygen  Post-op Assessment: Report given to PACU RN, Post -op Vital signs reviewed and stable and Patient moving all extremities X 4  Post vital signs: Reviewed and stable  Complications: No apparent anesthesia complications

## 2012-12-28 NOTE — Op Note (Signed)
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 2) Malfunctioning IT pump 3) chronic pain syndrome 4) chronic post-traumatic pain POSTOP DX:1) failure of IT therapy. 2) Malfunctioning IT pump 3) chronic pain syndrome 4) chronic post-traumatic pain  PROCEDURES: 1) exploration of IT pump 2) catheter dye study e.g "pumpogram" 3) revision of intrathecal drug delivery catheter 4) IT pump reprogramming  SURGEON: Pamalee Marcoe ASSISTANT: none ANESTHESIA: GETA  FINDINGS: substantial clear fluid in pump pocket; fracture of catheter connector at pump connection terminal  EBL: <10cc  DESCRIPTION OF PROCEDURE: After a discussion of risks, benefits and alternatives, informed consent was obtained. The patient was taken to the OR, a plane of general anesthesia induced, and the patient intubated by the anesthesiology staff. The patient was then positioned in a right lateral decubitus position on the OR table, an axillary roll placed, and extreme care taken to position the patient so that all pressure points were padded, limbs and head all in a neutral position.  The patient's abdomen, flank and lumbar spine were prepped with betadine scrub and chloraprep, and draped into a sterile field.  A timeout was taken to verify the appropriate patient, position, procedure, availability of equipment and personnel, and administration of perioperative antibiotics.   The skin was incised with a 10 blade, and careful sharp dissection carried down to the pump with the Metzenbaum scissors. Upon entering the pocket, copious clear fluid projected from the entry point. A syringe was quickly placed into the opening and 20cc of clear colorless fluid collected and sent for routine culture and drug quantitation. The rest of the fluid--some 25 more cc-- was suctioned from the pocket. The pump then was inspected, and easily identified was that there was no connection between the catheter and the pump side port connector. By all appearances, the retention  flange was still attached by a suture to the side port nipple. The rest of the catheter appeared to be intact, and clear fluid--likely CSF--dripping through the proximal catheter end. Careful placement of a catheter-tipped needle into the lumen of the catheter allowed for aspiration of 1cc of the clear fluid; given that the calculated volume of the catheter is 0.229 mL, the catheter was flushed of slightly greater than 4 volumes.   At that point, a draped c-arm was brought into the field and a catheter dye study was performed to make sure that the catheter itself was verifiably intact.  Omnipaque 300 was gently injected into the proximal catheter end, and seen to fill the entire length of the catheter, and into the intrathecal space creating a myelogram.   With verification that the catheter was otherwise functioning properly, attention was then turned to revision of the catheter. A [employer] extension catheter with self-retaining fastener was measured against the in vivo catheter. Sections of equivalent length--measured at 15 cm on the field, but measured at 13.5 cm independently; in either case the segments were of equal length--were cut from the proximal ends of each catheter, and then the new segment connected to the new indwelling proximal end with an appropriate connector.  CSF flowed freely from the new end of the catheter. A rubber shod clamp was placed carefully to fix the flow of CSF.  The pump was carefully cleaned, and using a fresh, clean Huber needle the reservoir was accessed and drained of approximately 26.5 mL of clear, colorless fluid into sterile syringes. Now with knowledge of exactly how much drug was in the pump, we carefully re-instilled 25cc back into the reservoir.  The new proximal end of the catheter was connected to the side port of the pump after removing the rubber shod, and CSF was easily aspirated through the side port of the pump. With now a functional pump/catheter  assembly, the pocket was inspected, fenestrated with the bovie in several places to avoid seroma formation, re-inspected and found to be hemostatic.  2-0 silk sutures were placed in the medial and lateral corners of the incision, and another interrupted 2--0 silk suture placed in the inferolateral aspect of the pocket.  The residual catheter was carefully coiled on the posterior face of the pump, and these sutures were then placed through the retention angles of the pump itself, with care taken to not incorporate or tie off the catheter into the sutures when they were tied; the pump was fixed into place with the sutures in an orientation with the side port directed cephalad, or 12 o'clock position.   The pocket was then copiously irrigated with bacitracin-containing irrigation, and the incision closed with 2 layers of 2-0 interrupted vicryl sutures, and the skin closed with staples. Bacitracin ointment and a sterile, occlusive dressing were placed. Needle, instrument and sponge counts were correct x2 at the end of the case.    The pump was re-programmed then to indicate its previous drug admixture, running at a simple continuous infusion with delivery of dilaudid at 0.5 mg/24 hour period.  The dose was lowered to this level as a safety measure given the likelihood that the patient had not been receiving anywhere near his usual dose of intrathecal dilaudid for an unknown period of time. With the re-programming, the new reservoir alarm date is currently 12/20/2013.  ERI is 68 months.   The patient was turned onto a stretcher, extubated by the anesthesiology team, and taken to PACU in stable condition.   DRAINS: none SPECIMENS: fluid sent for analysis/culture  COMPLICATIONS: none CONDITION: stable throughout, and to the PACU  DISPOSITION: discharge home after recovery. No abx. Pt. Has pain medication. He will follow up with me in 2 weeks for a wound check and removal of staples if appropriate. Case  discussed with the patient's family.

## 2012-12-28 NOTE — H&P (Addendum)
Pt. Seen 12/01/2012 at the request of Dr. Maple Hudson, with Dr. Venetia Maxon being unavailable. Please see my office note of that date. Dr. Venetia Maxon has seen the patient since; his follow up note is below.   PMH/PSH/medications/allergies:reviewed; as documented in EMR  PE: unremarkable ROS: no additions   OUTPATIENT OFFICE NOTE   HOPI:                                                   [name] returns today to review his pump situation.  I reviewed the records and also had lengthy discussion with Dr. Ollen Bowl as well as [name].   IMPRESSION/PLAN:                             Our plan is to go ahead and open the pump pocket, interrogate the pump, make sure the catheter is intact, replace the catheter and/or the pump if necessary.  I explained to him that if there is a piece of catheter within the subarachnoid space that this will be much more involved issue in term of removing it and I do not feel strongly it needed to removed if [name] is completely asymptomatic.  [name] wishes to go ahead with surgery.  We will set it up at time when Dr. Ollen Bowl is available and I am available as well, and the first coincident of those two factors is on the 12/28/2012.  [name] was not ecstatic about the timing, but I indicated that was probably the best we can do in terms of [place] both being available to do that.  [name] understands the risks and benefits and wishes to proceed.       _________________________________ Danae Orleans Venetia Maxon, MD     NEUROSURGICAL CONSULTATION   [name]  #[id number]  DOB:  1955/08/02                                                                 November 12, 2011   HISTORY OF PRESENT ILLNESS:                   [name] comes in today.  [name] is currently 57 years old and disabled.  [name] had a spinal cord stimulator placed on 01/10/2003 and a pain pump placed on 05/06/2005 both by me.  Four years ago [name] had a left lower leg surgery and left total knee replacement.  [name] has had multiple left legs  surgeries for which [name] has been disabled.  [name] met with me and met with the [employer] representative who assessed his devices.  [name] is currently receiving Dilaudid, Bupivacaine and Fentanyl through Dr. Roxy Cedar office.  [name] has 2 months until [name] needs a refill which is scheduled for refill 9/8.  The current voltage left in his spinal cord stimulator is 2.6 volts.  [name] finds the devices are very helpful and wants to keep them in use.  Currently, [name] is doing well otherwise.  [name] wants to go ahead with revision of the spinal cord stimulator also implantable pump.  We decided  it made better sense to replace the pump and transfer the medication at least one month out from the refill date to avoid risk of infection.  Additionally, [name] is not sure whether [name] wants to go with the rechargeable stimulator or implantable pulse generator or to use a non-rechargeable device.  [name] is going to speak with the engineers from [employer] to decide how [name] wants to proceed with that and we plan on doing both revisions at the same time.  [name] has had no issues with his pump in terms of flow and it has worked well.  His pump is in the left lower abdomen.  His spinal cord stimulator implantable pulse generator is in the right lower back.  Both of these appear to be in good shape with well healed incisions and no tenderness.    [name] is otherwise stable medically.    REVIEW OF SYSTEMS:                                     Review of Systems was reviewed with the patient.  Pertinent positives include Eyes - [name] wears glasses; Cardiovascular - high blood pressure; Musculoskeletal - arthritis.    PAST MEDICAL HISTORY:                                         Current Medical Conditions:  [name] has a history of high blood pressure            Prior Operations and Hospitalizations:  Spinal cord stimulator.             Height and Weight:  Currently [name] is 5', 11" tall and 300 lbs.   [name]  #[id number]  DOB:  Oct 19, 1955                                                                  November 12, 2011 [place]   FAMILY HISTORY:                                                             Mother is age 36 with a history of atrial fibrillation.    SOCIAL HISTORY:                                                              [name] denies tobacco, alcohol or drug use.    IMPRESSION AND RECOMMENDATIONS:               After discussion with the patient, [name] does wish to proceed with revision.  [name] would need to have his left abdomen pump refill replaced and be turned, so that we can gain access to his  IPG site.  [name] wishes to do this in early August and is going to decide how [name] wishes to proceed with the stimulator before then.  I spent 45 minutes in direct patient care and reviewing options with the patient and [name] wishes to proceed.  Risks and benefits were discussed.    [facility]       Danae Orleans. Venetia Maxon, M.D.

## 2012-12-30 ENCOUNTER — Encounter (HOSPITAL_COMMUNITY): Payer: Self-pay | Admitting: Anesthesiology

## 2013-01-01 LAB — BODY FLUID CULTURE
Culture: NO GROWTH
Gram Stain: NONE SEEN

## 2014-11-08 ENCOUNTER — Other Ambulatory Visit (HOSPITAL_COMMUNITY): Payer: Self-pay | Admitting: Anesthesiology

## 2014-11-08 DIAGNOSIS — Z978 Presence of other specified devices: Secondary | ICD-10-CM

## 2014-11-09 ENCOUNTER — Encounter: Payer: Self-pay | Admitting: *Deleted

## 2014-11-13 ENCOUNTER — Ambulatory Visit (HOSPITAL_COMMUNITY)
Admission: RE | Admit: 2014-11-13 | Discharge: 2014-11-13 | Disposition: A | Discharge status: Home / Self Care | Payer: Medicare Other | Source: Ambulatory Visit | Attending: Anesthesiology | Admitting: Anesthesiology

## 2014-11-13 ENCOUNTER — Other Ambulatory Visit: Payer: Self-pay | Admitting: Radiology

## 2014-11-13 DIAGNOSIS — Z978 Presence of other specified devices: Secondary | ICD-10-CM

## 2014-11-13 DIAGNOSIS — R188 Other ascites: Secondary | ICD-10-CM

## 2014-11-13 NOTE — Progress Notes (Signed)
Patient ID: [name], male   DOB: 12-Jul-1955, 59 y.o.   MRN: [id number]   Pt presents today for abdominal fluid collection aspiration per Dr Maryjean Ka order Pt has intrathecal pain pump LLQ abdomen Secondary several lower extremity surgeries Palpable device; NT Noticeable enlargement of LLQ compared to RLQ area Pt states he has had a malfunctioning catheter 1 yr ago and presented same way.  Unfortunately, this procedure was actually ordered as a paracentesis  Pt here without any previous imaging; although he has been npo since [place] He is alone without a driver.  Discussed with Dr Earleen Newport He needs imaging to determine if pt is even candidate for aspiration and to fully evaluate anatomy and findings of collection.  Discussed with Dr Maryjean Ka He understands we need imaging to review before we can safely move forward with any type of aspiration procedure.  I have ordered CT Abd/pelvis w/o contrast per Dr Earleen Newport and Dr Maryjean Ka to evaluate collection Pre certification is underway  Pt has been tentatively scheduled for Wed July 6 for CT and possible aspiration. He is agreeable to plan Leaves here today with good understanding of plan.

## 2014-11-14 ENCOUNTER — Ambulatory Visit: Payer: Medicare Other | Admitting: Diagnostic Neuroimaging

## 2014-11-21 ENCOUNTER — Encounter: Payer: Self-pay | Admitting: Diagnostic Neuroimaging

## 2014-11-21 ENCOUNTER — Other Ambulatory Visit: Payer: Self-pay | Admitting: Anesthesiology

## 2014-11-21 ENCOUNTER — Ambulatory Visit (INDEPENDENT_AMBULATORY_CARE_PROVIDER_SITE_OTHER): Payer: Medicare Other | Admitting: Diagnostic Neuroimaging

## 2014-11-21 VITALS — BP 126/76 | HR 96 | Ht 70.5 in | Wt 264.8 lb

## 2014-11-21 DIAGNOSIS — R200 Anesthesia of skin: Secondary | ICD-10-CM

## 2014-11-21 DIAGNOSIS — R208 Other disturbances of skin sensation: Secondary | ICD-10-CM

## 2014-11-21 DIAGNOSIS — R1032 Left lower quadrant pain: Secondary | ICD-10-CM

## 2014-11-21 DIAGNOSIS — R42 Dizziness and giddiness: Secondary | ICD-10-CM

## 2014-11-21 NOTE — Progress Notes (Signed)
[facility]  PATIENT: [name] DOB: 06/11/1955  REFERRING CLINICIAN: Harkins HISTORY FROM: patient and wife  REASON FOR VISIT: new consult    HISTORICAL  CHIEF COMPLAINT:  Chief Complaint  Patient presents with  . dizziness    new patient, rm 24, wife - Pat    HISTORY OF PRESENT ILLNESS:   59 year old male here for evaluation of dizziness. For past 2 months patient has had "swimmy headed" sensation, worse when standing, associated with blurred vision and nausea. No vertigo or lightheadedness. Some right facial numbness or tingling. No change in medications recently. Patient feels that his pain pump is leaking medication into his abdomen and this is causing his problem. Apparently this happened several years ago and has caused the same kind of problem now. He is planning to have additional testing to see if there is any fluid or medication leakage related to the pain pump.   REVIEW OF SYSTEMS: Full 14 system review of systems performed and notable only for fatigue blurred vision abdominal pain nausea, and bowels apnea snoring joint pain back pain aching muscles walking difficulty dizziness numbness.  ALLERGIES: Allergies  Allergen Reactions  . Norvasc  [Amlodipine Besylate] Swelling    HOME MEDICATIONS: Outpatient Prescriptions Prior to Visit  Medication Sig Dispense Refill  . aspirin 81 MG tablet Take 81 mg by mouth daily.    [name] b complex vitamins tablet Take 1 tablet by mouth daily.    [name] omeprazole (PRILOSEC) 20 MG capsule Take 20 mg by mouth daily.    [name] POTASSIUM GLUCONATE PO Take 1 tablet by mouth daily.    [name] PRESCRIPTION MEDICATION Inject into the skin continuous. This is a combination pain pump. Hydromorphone 7.5 mg/ml, Fentanyl 325 mcg/ml and Bupivacaine 3 mg/ml    . oxyCODONE (ROXICODONE) 15 MG immediate release tablet Take 15 mg by mouth every 8 (eight) hours as needed for pain. For pain    . lisinopril-hydrochlorothiazide  (PRINZIDE,ZESTORETIC) 20-12.5 MG per tablet Take 2 tablets by mouth every morning.      No facility-administered medications prior to visit.    PAST MEDICAL HISTORY: Past Medical History  Diagnosis Date  . Hypertension   . Diabetes mellitus     control with diet/does not check cbgs at home  . Arthritis     knees and back  . Sleep apnea     wears bipap  . GERD (gastroesophageal reflux disease)   . Dizziness of unknown cause     when accessing the pump to refill for about week after    PAST SURGICAL HISTORY: Past Surgical History  Procedure Laterality Date  . Tonsillectomy    . Appendectomy    . Cholecystectomy    . Wrist arthroplasty      right = repair cartiledge  . Heel spurs      bilateral  . Hammer toe surgery    . Pain pump implantation    . Joint replacement      bilateral knees  . Muscle flap closure      skin grafting for muscle shifting  . Spinal cord stimulator insertion  12/23/2011    Procedure: LUMBAR SPINAL CORD STIMULATOR INSERTION;  Surgeon: [name], MD;  Location: [facility];  Service: Neurosurgery;  Laterality: N/A;  Implantable pulse generator change and pump revision  . Pain pump revision N/A 12/28/2012    Procedure: intrathecal Baclofen pump exploration with revision of catheter;  Surgeon: [name], MD;  Location: [facility];  Service: Neurosurgery;  Laterality: N/A;  Baclofen pump exploration with [name] to assist    FAMILY HISTORY: Family History  Problem Relation Age of Onset  . Atrial fibrillation Mother     SOCIAL HISTORY:  History   Social History  . Marital Status: Married    Spouse Name: Fraser Din  . Number of Children: 1  . Years of Education: 12   Occupational History  . disabled     since 2011   Social History Main Topics  . Smoking status: Former Smoker    Quit date: 01/18/2011  . Smokeless tobacco: Not on file     Comment: 11/21/14 smoking off and on, occasionally  . Alcohol Use: No  . Drug Use: No  . Sexual  Activity: Not on file   Other Topics Concern  . Not on file   Social History Narrative   Married, on disability since 2011   Caffeine use - tea 3-4 glasses daily     PHYSICAL EXAM  GENERAL EXAM/CONSTITUTIONAL: Vitals:  Filed Vitals:   11/21/14 1422  BP: 126/76  Pulse: 96  Height: 5' 10.5" (1.791 m)  Weight: 264 lb 12.8 oz (120.112 kg)     Body mass index is 37.45 kg/(m^2).  Visual Acuity Screening   Right eye Left eye Both eyes  Without correction:     With correction: 20/30 20/30      Patient is in no distress; well developed, nourished and groomed; neck is supple  SCAR AND POST-SURGICAL CHANGES IN LEFT LOWER LEG  CARDIOVASCULAR:  Examination of carotid arteries is normal; no carotid bruits  Regular rate and rhythm, no murmurs  Examination of peripheral vascular system by observation and palpation is normal  EYES:  Ophthalmoscopic exam of optic discs and posterior segments is normal; no papilledema or hemorrhages  MUSCULOSKELETAL:  Gait, strength, tone, movements noted in Neurologic exam below  NEUROLOGIC: MENTAL STATUS:  No flowsheet data found.  awake, alert, oriented to person, place and time  recent and remote memory intact  normal attention and concentration  language fluent, comprehension intact, naming intact,   fund of knowledge appropriate  CRANIAL NERVE:   2nd - no papilledema on fundoscopic exam  2nd, 3rd, 4th, 6th - pupils equal and reactive to light, visual fields full to confrontation, extraocular muscles intact, no nystagmus  5th - facial sensation symmetric  7th - facial strength symmetric  8th - hearing intact  9th - palate elevates symmetrically, uvula midline  11th - shoulder shrug symmetric  12th - tongue protrusion midline  MOTOR:   normal bulk and tone, full strength in the BUE, BLE  SENSORY:   normal and symmetric to light touch, temperature, vibration  COORDINATION:   finger-nose-finger, fine finger  movements normal  REFLEXES:   deep tendon reflexes TRACE and symmetric  GAIT/STATION:   narrow based gait; ROMBERG NEGATIVE    DIAGNOSTIC DATA (LABS, IMAGING, TESTING) - I reviewed patient records, labs, notes, testing and imaging myself where available.  Lab Results  Component Value Date   WBC 6.7 12/23/2012   HGB 13.5 12/23/2012   HCT 39.6 12/23/2012   MCV 85.2 12/23/2012   PLT 159 12/23/2012      Component Value Date/Time   NA 138 12/23/2012 1244   K 3.6 12/23/2012 1244   CL 101 12/23/2012 1244   CO2 27 12/23/2012 1244   GLUCOSE 92 12/23/2012 1244   BUN 12 12/23/2012 1244   CREATININE 0.85 12/23/2012 1244   CALCIUM 9.6 12/23/2012 1244  PROT 6.6 09/28/2008 1102   ALBUMIN 3.6 09/28/2008 1102   AST 45* 09/28/2008 1102   ALT 49 09/28/2008 1102   ALKPHOS 73 09/28/2008 1102   BILITOT 0.4 09/28/2008 1102   GFRNONAA >90 12/23/2012 1244   GFRAA >90 12/23/2012 1244   No results found for: CHOL, HDL, LDLCALC, LDLDIRECT, TRIG, CHOLHDL No results found for: HGBA1C No results found for: VITAMINB12 No results found for: TSH  12/23/12 CXR [I reviewed images myself and agree with interpretation. -VRP]  - No acute cardiopulmonary disease identified.    ASSESSMENT AND PLAN  59 y.o. year old male here with intermittent dizziness, nausea, blurred vision, right facial numbness since May 2016.   Ddx: TIA, stroke, medication side effect, metabolic  PLAN: - add'l testing - follow up with PCP for general medication / metabolic workup  Orders Placed This Encounter  Procedures  . CT Head Wo Contrast  . US Carotid Bilateral   Return in about 6 weeks (around 01/02/2015).    Penni Bombard, MD 05/19/1550, 0:80 PM Certified in Neurology, Neurophysiology and Neuroimaging  [facility] [address] [place], [place] [address] [phone]

## 2014-11-21 NOTE — Patient Instructions (Signed)
i will check additional testing. 

## 2014-11-22 ENCOUNTER — Telehealth: Payer: Self-pay | Admitting: Diagnostic Neuroimaging

## 2014-11-22 ENCOUNTER — Ambulatory Visit (HOSPITAL_COMMUNITY): Payer: Medicare Other

## 2014-11-22 NOTE — Telephone Encounter (Signed)
Spoke with Baker Janus who states patient said he is to have carotid dopplers done at [place]. Informed her that dopplers will be done at [facility]. She stated she would inform patient. She thanked this caller for quick response to her call.

## 2014-11-22 NOTE — Telephone Encounter (Signed)
Baker Janus with [facility] has some questions regarding an order Dr. Leta Baptist put in for the patient. Please call and advise.

## 2014-11-27 ENCOUNTER — Ambulatory Visit
Admission: RE | Admit: 2014-11-27 | Discharge: 2014-11-27 | Disposition: A | Discharge status: Home / Self Care | Payer: Medicare Other | Source: Ambulatory Visit | Attending: Diagnostic Neuroimaging | Admitting: Diagnostic Neuroimaging

## 2014-11-27 DIAGNOSIS — R42 Dizziness and giddiness: Secondary | ICD-10-CM | POA: Diagnosis not present

## 2014-11-27 DIAGNOSIS — R208 Other disturbances of skin sensation: Secondary | ICD-10-CM | POA: Diagnosis not present

## 2014-11-27 DIAGNOSIS — R200 Anesthesia of skin: Secondary | ICD-10-CM

## 2014-11-29 ENCOUNTER — Ambulatory Visit (HOSPITAL_COMMUNITY)
Admission: RE | Admit: 2014-11-29 | Discharge: 2014-11-29 | Disposition: A | Discharge status: Home / Self Care | Payer: Medicare Other | Source: Ambulatory Visit | Attending: Anesthesiology | Admitting: Anesthesiology

## 2014-11-29 ENCOUNTER — Ambulatory Visit (HOSPITAL_COMMUNITY): Payer: Medicare Other

## 2014-11-29 ENCOUNTER — Encounter (HOSPITAL_COMMUNITY): Payer: Self-pay

## 2014-11-29 DIAGNOSIS — R1032 Left lower quadrant pain: Secondary | ICD-10-CM

## 2014-11-29 DIAGNOSIS — D3502 Benign neoplasm of left adrenal gland: Secondary | ICD-10-CM | POA: Diagnosis not present

## 2014-11-29 DIAGNOSIS — R109 Unspecified abdominal pain: Secondary | ICD-10-CM | POA: Insufficient documentation

## 2014-12-06 ENCOUNTER — Ambulatory Visit (INDEPENDENT_AMBULATORY_CARE_PROVIDER_SITE_OTHER): Payer: Medicare Other

## 2014-12-06 DIAGNOSIS — R200 Anesthesia of skin: Secondary | ICD-10-CM

## 2014-12-06 DIAGNOSIS — R42 Dizziness and giddiness: Secondary | ICD-10-CM | POA: Diagnosis not present

## 2014-12-11 NOTE — Progress Notes (Signed)
PRELIMINARY CAROTID DUPLEX STUDY...  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  MILD WALL THICKENING WAS PRESENTED IN THE LEFT ECA

## 2014-12-11 NOTE — Progress Notes (Signed)
This encounter was created in error - please disregard.

## 2014-12-22 ENCOUNTER — Telehealth: Payer: Self-pay | Admitting: Diagnostic Neuroimaging

## 2014-12-22 DIAGNOSIS — G458 Other transient cerebral ischemic attacks and related syndromes: Secondary | ICD-10-CM

## 2014-12-22 DIAGNOSIS — I6522 Occlusion and stenosis of left carotid artery: Secondary | ICD-10-CM

## 2014-12-22 NOTE — Telephone Encounter (Signed)
I called patient with results. Has left ICA stenosis 50-69%. Continues with intermittent right face numbness. This represent symptomatic left ICA stenosis. Agree with current medical mgmt (aspirin, BP control, monitor lipids and sugar, OSA treatment with bipap). Will refer to vascular surgeon for consult as well.   Penni Bombard, MD 08/18/6832, 1:96 PM Certified in Neurology, Neurophysiology and Neuroimaging  [facility] [address], [place], [place] [address] [phone]

## 2015-01-04 ENCOUNTER — Other Ambulatory Visit: Payer: Self-pay

## 2015-01-04 ENCOUNTER — Encounter: Payer: Self-pay | Admitting: Diagnostic Neuroimaging

## 2015-01-04 ENCOUNTER — Ambulatory Visit (INDEPENDENT_AMBULATORY_CARE_PROVIDER_SITE_OTHER): Payer: Medicare Other | Admitting: Diagnostic Neuroimaging

## 2015-01-04 VITALS — BP 124/81 | HR 91 | Ht 70.0 in | Wt 268.2 lb

## 2015-01-04 DIAGNOSIS — I6529 Occlusion and stenosis of unspecified carotid artery: Secondary | ICD-10-CM | POA: Insufficient documentation

## 2015-01-04 DIAGNOSIS — R200 Anesthesia of skin: Secondary | ICD-10-CM

## 2015-01-04 DIAGNOSIS — G458 Other transient cerebral ischemic attacks and related syndromes: Secondary | ICD-10-CM | POA: Diagnosis not present

## 2015-01-04 DIAGNOSIS — R42 Dizziness and giddiness: Secondary | ICD-10-CM

## 2015-01-04 DIAGNOSIS — R208 Other disturbances of skin sensation: Secondary | ICD-10-CM | POA: Diagnosis not present

## 2015-01-04 DIAGNOSIS — I6523 Occlusion and stenosis of bilateral carotid arteries: Secondary | ICD-10-CM

## 2015-01-04 DIAGNOSIS — I6522 Occlusion and stenosis of left carotid artery: Secondary | ICD-10-CM | POA: Diagnosis not present

## 2015-01-04 NOTE — Patient Instructions (Signed)
I will check echocardiogram.  Vascular surgery appointment: [facility] [place] 01/05/15 @ 9:45am.  Continue aspirin, statin, blood pressure medications.  Please stop smoking!

## 2015-01-04 NOTE — Progress Notes (Signed)
[facility]  PATIENT: [name] DOB: 29-Jan-1956  REFERRING CLINICIAN: Harkins HISTORY FROM: patient  REASON FOR VISIT: follow up    HISTORICAL  CHIEF COMPLAINT:  Chief Complaint  Patient presents with  . Dizziness    rm 7, "dizziness improving"  . Follow-up    6 weeks    HISTORY OF PRESENT ILLNESS:   UPDATE 01/04/15: Since last visit, continues to have dizziness and right face numbness, almost daily, but less severe and often than before.  PRIOR HPI (11/21/14): 59 year old male here for evaluation of dizziness. For past 2 months patient has had "swimmy headed" sensation, worse when standing, associated with blurred vision and nausea. No vertigo or lightheadedness. Some right facial numbness or tingling. No change in medications recently. Patient feels that his pain pump is leaking medication into his abdomen and this is causing his problem. Apparently this happened several years ago and has caused the same kind of problem now. He is planning to have additional testing to see if there is any fluid or medication leakage related to the pain pump.   REVIEW OF SYSTEMS: Full 14 system review of systems performed and notable only for aching muscles.   ALLERGIES: Allergies  Allergen Reactions  . Norvasc  [Amlodipine Besylate] Swelling    HOME MEDICATIONS: Outpatient Prescriptions Prior to Visit  Medication Sig Dispense Refill  . aspirin 81 MG tablet Take 81 mg by mouth daily.    [name] b complex vitamins tablet Take 1 tablet by mouth daily.    . Bupivacaine HCl POWD by Does not apply route.    . dicyclomine (BENTYL) 20 MG tablet Take 20 mg by mouth.    . diphenhydrAMINE (BENADRYL) 25 mg capsule Take by mouth.    . Fentanyl Citrate POWD by Does not apply route.    . fluticasone (FLONASE) 50 MCG/ACT nasal spray Place 1 spray into the nose.    . gabapentin (NEURONTIN) 800 MG tablet     . hydrochlorothiazide (HYDRODIURIL) 25 MG tablet TAKE ONE TABLET BY  MOUTH ONCE DAILY    . HYDROcodone-acetaminophen (NORCO/VICODIN) 5-325 MG per tablet as needed.    . hydromorphone, bulk, POWD by Does not apply route.    [name] lisinopril (PRINIVIL,ZESTRIL) 40 MG tablet TAKE ONE TABLET BY MOUTH ONCE DAILY    . omeprazole (PRILOSEC) 20 MG capsule Take 20 mg by mouth daily.    [name] oxyCODONE (ROXICODONE) 15 MG immediate release tablet Take 15 mg by mouth every 8 (eight) hours as needed for pain. For pain    . POTASSIUM GLUCONATE PO Take 1 tablet by mouth daily.    [name] PRESCRIPTION MEDICATION Inject into the skin continuous. This is a combination pain pump. Hydromorphone 7.5 mg/ml, Fentanyl 325 mcg/ml and Bupivacaine 3 mg/ml    . promethazine (PHENERGAN) 25 MG tablet     . traZODone (DESYREL) 150 MG tablet      No facility-administered medications prior to visit.    PAST MEDICAL HISTORY: Past Medical History  Diagnosis Date  . Hypertension   . Diabetes mellitus     control with diet/does not check cbgs at home  . Arthritis     knees and back  . Sleep apnea     wears bipap  . GERD (gastroesophageal reflux disease)   . Dizziness of unknown cause     when accessing the pump to refill for about week after    PAST SURGICAL HISTORY: Past Surgical History  Procedure Laterality Date  . Tonsillectomy    .  Appendectomy    . Cholecystectomy    . Wrist arthroplasty      right = repair cartiledge  . Heel spurs      bilateral  . Hammer toe surgery    . Pain pump implantation    . Joint replacement      bilateral knees  . Muscle flap closure      skin grafting for muscle shifting  . Spinal cord stimulator insertion  12/23/2011    Procedure: LUMBAR SPINAL CORD STIMULATOR INSERTION;  Surgeon: [name], MD;  Location: [place];  Service: Neurosurgery;  Laterality: N/A;  Implantable pulse generator change and pump revision  . Pain pump revision N/A 12/28/2012    Procedure: intrathecal Baclofen pump exploration with revision of catheter;  Surgeon: [name], MD;  Location: [employer] [facility];  Service: Neurosurgery;  Laterality: N/A;  Baclofen pump exploration with [name] to assist    FAMILY HISTORY: Family History  Problem Relation Age of Onset  . Atrial fibrillation Mother     SOCIAL HISTORY:  Social History   Social History  . Marital Status: Married    Spouse Name: Fraser Din  . Number of Children: 1  . Years of Education: 12   Occupational History  . disabled     since 2011   Social History Main Topics  . Smoking status: Former Smoker    Quit date: 01/18/2011  . Smokeless tobacco: Not on file     Comment: 11/21/14 smoking off and on, occasionally  . Alcohol Use: No  . Drug Use: No  . Sexual Activity: Not on file   Other Topics Concern  . Not on file   Social History Narrative   Married, on disability since 2011   Caffeine use - tea 3-4 glasses daily     PHYSICAL EXAM  GENERAL EXAM/CONSTITUTIONAL: Vitals:  Filed Vitals:   01/04/15 1251  BP: 124/81  Pulse: 91  Height: 5\' 10"  (1.778 m)  Weight: 268 lb 3.2 oz (121.655 kg)   Body mass index is 38.48 kg/(m^2). No exam data present  Patient is in no distress; well developed, nourished and groomed; neck is supple  SCAR AND POST-SURGICAL CHANGES IN LEFT LOWER LEG  CARDIOVASCULAR:  Examination of carotid arteries is normal; no carotid bruits  Regular rate and rhythm, no murmurs  Examination of peripheral vascular system by observation and palpation is normal  EYES:  Ophthalmoscopic exam of optic discs and posterior segments is normal; no papilledema or hemorrhages  MUSCULOSKELETAL:  Gait, strength, tone, movements noted in Neurologic exam below  NEUROLOGIC: MENTAL STATUS:  No flowsheet data found.  awake, alert, oriented to person, place and time  recent and remote memory intact  normal attention and concentration  language fluent, comprehension intact, naming intact,   fund of knowledge appropriate  CRANIAL NERVE:   2nd - no  papilledema on fundoscopic exam  2nd, 3rd, 4th, 6th - pupils equal and reactive to light, visual fields full to confrontation, extraocular muscles intact, no nystagmus  5th - facial sensation symmetric  7th - facial strength symmetric  8th - hearing intact  9th - palate elevates symmetrically, uvula midline  11th - shoulder shrug symmetric  12th - tongue protrusion midline  MOTOR:   normal bulk and tone, full strength in the BUE, BLE  SENSORY:   normal and symmetric to light touch, temperature, vibration  COORDINATION:   finger-nose-finger, fine finger movements normal  REFLEXES:   deep tendon reflexes TRACE and symmetric  GAIT/STATION:   narrow based gait; ROMBERG NEGATIVE    DIAGNOSTIC DATA (LABS, IMAGING, TESTING) - I reviewed patient records, labs, notes, testing and imaging myself where available.  Lab Results  Component Value Date   WBC 6.7 12/23/2012   HGB 13.5 12/23/2012   HCT 39.6 12/23/2012   MCV 85.2 12/23/2012   PLT 159 12/23/2012      Component Value Date/Time   NA 138 12/23/2012 1244   K 3.6 12/23/2012 1244   CL 101 12/23/2012 1244   CO2 27 12/23/2012 1244   GLUCOSE 92 12/23/2012 1244   BUN 12 12/23/2012 1244   CREATININE 0.85 12/23/2012 1244   CALCIUM 9.6 12/23/2012 1244   PROT 6.6 09/28/2008 1102   ALBUMIN 3.6 09/28/2008 1102   AST 45* 09/28/2008 1102   ALT 49 09/28/2008 1102   ALKPHOS 73 09/28/2008 1102   BILITOT 0.4 09/28/2008 1102   GFRNONAA >90 12/23/2012 1244   GFRAA >90 12/23/2012 1244   No results found for: CHOL, HDL, LDLCALC, LDLDIRECT, TRIG, CHOLHDL No results found for: HGBA1C No results found for: VITAMINB12 No results found for: TSH   12/30/12 EKG [I reviewed images myself and agree with interpretation. -VRP]  - normal sinus rhythm  12/23/12 CXR [I reviewed images myself and agree with interpretation. -VRP]  - No acute cardiopulmonary disease identified.  11/27/14 CT head [I reviewed images myself and agree with  interpretation. -VRP]  - normal  12/06/14 carotid u/s  - 50-69% stenosis of mid left ICA with moderate focal plaque - bilateral vertebral arteries are ansterograde     ASSESSMENT AND PLAN  59 y.o. year old male here with intermittent dizziness, nausea, blurred vision, right facial numbness since May 2016. Also with left ICA stenosis 50-69%, which I consider symptomatic (causing right face numbness TIAs). Dizziness spells are non-specific, and may be medication side effect, metabolic or something else non-specific. Patient cannot have MRI due to spinal cord stimulator (from ~2000; not MRI compatible).    Dx:  Carotid stenosis, left  Other specified transient cerebral ischemias  Dizziness and giddiness  Right facial numbness    PLAN: - follow up referral to vascular surgery (referral placed on 12/22/14); I called office myself to help patient setup appointment - follow up with PCP for medical mgmt for stroke risk reduction (aspirin, BP control, statin, monitoring for diabetes, smoking cessation, OSA treatment with bipap) - check TTE to look for cardiac source of emboli  Orders Placed This Encounter  Procedures  . ECHOCARDIOGRAM COMPLETE   Return in about 3 months (around 04/06/2015).    Penni Bombard, MD 0/24/0973, 5:32 PM Certified in Neurology, Neurophysiology and Neuroimaging  [facility] [address] [place], [place] [address] [phone]

## 2015-01-05 ENCOUNTER — Encounter: Payer: Self-pay | Admitting: Vascular Surgery

## 2015-01-05 ENCOUNTER — Ambulatory Visit (HOSPITAL_COMMUNITY)
Admission: RE | Admit: 2015-01-05 | Discharge: 2015-01-05 | Disposition: A | Discharge status: Home / Self Care | Payer: Medicare Other | Source: Ambulatory Visit | Attending: Vascular Surgery | Admitting: Vascular Surgery

## 2015-01-05 ENCOUNTER — Ambulatory Visit (INDEPENDENT_AMBULATORY_CARE_PROVIDER_SITE_OTHER): Payer: Medicare Other | Admitting: Vascular Surgery

## 2015-01-05 VITALS — BP 145/81 | HR 89 | Temp 98.4°F | Ht 70.0 in | Wt 268.4 lb

## 2015-01-05 DIAGNOSIS — R208 Other disturbances of skin sensation: Secondary | ICD-10-CM

## 2015-01-05 DIAGNOSIS — I6523 Occlusion and stenosis of bilateral carotid arteries: Secondary | ICD-10-CM | POA: Insufficient documentation

## 2015-01-05 DIAGNOSIS — R200 Anesthesia of skin: Secondary | ICD-10-CM

## 2015-01-05 NOTE — Addendum Note (Signed)
Addended by: Mena Goes on: 01/05/2015 05:21 PM   Modules accepted: Orders

## 2015-01-05 NOTE — Progress Notes (Signed)
New Carotid Patient  Referred by:  Alma Friendly, MD No address on file  Reason for referral: B carotid stenosis  History of Present Illness  [name] is a 59 y.o. (September 01, 1955) male who presents with chief complaint: "swimmy head".  Patient previous has had a spinal stimulator placed that has resulted in these sxPrevious carotid studies demonstrated: RICA <65% stenosis, LICA >03% stenosis.  Patient has no history of TIA or stroke symptom.  The patient has never had amaurosis fugax or monocular blindness.  The patient has never had facial drooping or hemiplegia.  The patient has never had receptive or expressive aphasia.   The patient is having R hemifacial numbness and paraesthesias that the neurologist felt might be due to carotid disease.  The patient's risks factors for carotid disease include: HTN, DM, HLD, and active smoking.  Past Medical History  Diagnosis Date  . Hypertension   . Diabetes mellitus     control with diet/does not check cbgs at home  . Arthritis     knees and back  . Sleep apnea     wears bipap  . GERD (gastroesophageal reflux disease)   . Dizziness of unknown cause     when accessing the pump to refill for about week after    Past Surgical History  Procedure Laterality Date  . Tonsillectomy    . Appendectomy    . Cholecystectomy    . Wrist arthroplasty      right = repair cartiledge  . Heel spurs      bilateral  . Hammer toe surgery    . Pain pump implantation    . Joint replacement      bilateral knees  . Muscle flap closure      skin grafting for muscle shifting  . Spinal cord stimulator insertion  12/23/2011    Procedure: LUMBAR SPINAL CORD STIMULATOR INSERTION;  Surgeon: [name], MD;  Location: [place] [facility];  Service: Neurosurgery;  Laterality: N/A;  Implantable pulse generator change and pump revision  . Pain pump revision N/A 12/28/2012    Procedure: intrathecal Baclofen pump exploration with revision of catheter;  Surgeon:  [name], MD;  Location: [place] [facility];  Service: Neurosurgery;  Laterality: N/A;  Baclofen pump exploration with [name] to assist    Social History   Social History  . Marital Status: Married    Spouse Name: Fraser Din  . Number of Children: 1  . Years of Education: 12   Occupational History  . disabled     since 2011   Social History Main Topics  . Smoking status: Current Every Day Smoker -- 0.50 packs/day    Types: Cigarettes  . Smokeless tobacco: Never Used     Comment: 11/21/14 smoking off and on, occasionally  . Alcohol Use: No  . Drug Use: No  . Sexual Activity: Not on file   Other Topics Concern  . Not on file   Social History Narrative   Married, on disability since 2011   Caffeine use - tea 3-4 glasses daily    Family History  Problem Relation Age of Onset  . Atrial fibrillation Mother   . Hypertension Mother   . Diabetes Father   . Hyperlipidemia Father   . Diabetes Brother   . Hypertension Brother     Current Outpatient Prescriptions on File Prior to Visit  Medication Sig Dispense Refill  . aspirin 81 MG tablet Take 81 mg by mouth daily.    [name]  atorvastatin (LIPITOR) 10 MG tablet Take 10 mg by mouth.    [name] b complex vitamins tablet Take 1 tablet by mouth daily.    . Bupivacaine HCl POWD by Does not apply route.    . dicyclomine (BENTYL) 20 MG tablet Take 20 mg by mouth.    . diphenhydrAMINE (BENADRYL) 25 mg capsule Take by mouth.    . Fentanyl Citrate POWD by Does not apply route.    . fluticasone (FLONASE) 50 MCG/ACT nasal spray Place 1 spray into the nose.    . gabapentin (NEURONTIN) 800 MG tablet     . hydrochlorothiazide (HYDRODIURIL) 25 MG tablet TAKE ONE TABLET BY MOUTH ONCE DAILY    . HYDROcodone-acetaminophen (NORCO/VICODIN) 5-325 MG per tablet as needed.    . hydromorphone, bulk, POWD by Does not apply route.    [name] lisinopril (PRINIVIL,ZESTRIL) 40 MG tablet TAKE ONE TABLET BY MOUTH ONCE DAILY    . omeprazole (PRILOSEC) 20 MG capsule Take 20  mg by mouth daily.    [name] oxyCODONE (ROXICODONE) 15 MG immediate release tablet Take 15 mg by mouth every 8 (eight) hours as needed for pain. For pain    . POTASSIUM GLUCONATE PO Take 1 tablet by mouth daily.    [name] PRESCRIPTION MEDICATION Inject into the skin continuous. This is a combination pain pump. Hydromorphone 7.5 mg/ml, Fentanyl 325 mcg/ml and Bupivacaine 3 mg/ml    . promethazine (PHENERGAN) 25 MG tablet     . traZODone (DESYREL) 150 MG tablet      No current facility-administered medications on file prior to visit.    Allergies  Allergen Reactions  . Norvasc  [Amlodipine Besylate] Swelling    REVIEW OF SYSTEMS:  (Positives checked otherwise negative)  CARDIOVASCULAR:  []  chest pain, []  chest pressure, []  palpitations, []  shortness of breath when laying flat, []  shortness of breath with exertion,  [x]  pain in feet when walking, []  pain in feet when laying flat, []  history of blood clot in veins (DVT), []  history of phlebitis, []  swelling in legs, []  varicose veins  PULMONARY:  []  productive cough, []  asthma, []  wheezing  NEUROLOGIC:  [x]  weakness in arms or legs, []  numbness in arms or legs, []  difficulty speaking or slurred speech, []  temporary loss of vision in one eye, []  dizziness  HEMATOLOGIC:  []  bleeding problems, []  problems with blood clotting too easily  MUSCULOSKEL:  [x]  joint pain, []  joint swelling, [x]  18 knee surgeries B  GASTROINTEST:  []  vomiting blood, []  blood in stool     GENITOURINARY:  []  burning with urination, []  blood in urine  PSYCHIATRIC:  []  history of major depression  INTEGUMENTARY:  []  rashes, []  ulcers  CONSTITUTIONAL:  []  fever, []  chills  For VQI Use Only  PRE-ADM LIVING: Home  AMB STATUS: Ambulatory  CAD Sx: None  PRIOR CHF: None  STRESS TEST: [x]  No, [ ]  Normal, [ ]  + ischemia, [ ]  + MI, [ ]  Both   Physical Examination  Filed Vitals:   01/05/15 0947 01/05/15 0949  BP: 131/85 145/81  Pulse: 82 89  Temp: 98.4 F (36.9 C)    TempSrc: Oral   Height: 5\' 10"  (1.778 m)   Weight: 268 lb 6.4 oz (121.745 kg)   SpO2: 100%    Body mass index is 38.51 kg/(m^2).  General: A&O x 3, WD, Obese,   Head: [place]/AT  Ear/Nose/Throat: Hearing grossly intact, nares w/o erythema or drainage, oropharynx w/o Erythema/Exudate, Mallampati score: 3  Eyes: PERRLA, EOMI  Neck: Supple, no nuchal rigidity, no palpable LAD  Pulmonary: Sym exp, good air movt, CTAB, no rales, rhonchi, & wheezing  Cardiac: RRR, Nl S1, S2, no Murmurs, rubs or gallops  Vascular: Vessel Right Left  Radial Palpable Palpable  Brachial Palpable Palpable  Carotid Palpable, without bruit Palpable, without bruit  Aorta  Not palpable N/A  Femoral Palpable Palpable  Popliteal Not palpable Not palpable  PT Palpable Palpable  DP Palpable Palpable   Gastrointestinal: soft, NTND, -G/R, - HSM, - masses, - CVAT B  Musculoskeletal: M/S 5/5 throughout , Extremities without ischemic changes , B knee incisions healed, L calf defect with obvious rotated muscle flap  Neurologic: CN 2-12 intact , Pain and light touch intact in extremities , Motor exam as listed above  Psychiatric: Judgment intact, Mood & affect appropriate for pt's clinical situation  Dermatologic: See M/S exam for extremity exam, no rashes otherwise noted  Lymph : No Cervical, Axillary, or Inguinal lymphadenopathy    Non-Invasive Vascular Imaging  CAROTID DUPLEX (Date: 01/05/2015):   R ICA stenosis: <50%  R VA: patent and antegrade  L ICA stenosis: <50%  L VA: patent and antegrade  Outside Studies/Documentation 4 pages of outside documents were reviewed including: outpatient neurology chart.  Medical Decision Making  SPYRIDON HORNSTEIN is a 59 y.o. male who presents with: B ICA stenosis <50%.   I'm not certain if this patient sx are TIA, but regardless, NASCET would suggest no benefit to CEA on either side given <50% disease bilaterally.  There was minimal plaque in either internal  carotid artery along with ICA/CCA ratio well below the threshold of hemodynamically significant disease.  Based on the patient's vascular studies and examination, I have offered the patient: q2 year B carotid duplex.  I discussed in depth with the patient the nature of atherosclerosis, and emphasized the importance of maximal medical management including strict control of blood pressure, blood glucose, and lipid levels, obtaining regular exercise, antiplatelet agents, and cessation of smoking.   The patient is currently on a statin: Lipitor.  The patient is currently on an anti-platelet: ASA.  The patient is aware that without maximal medical management the underlying atherosclerotic disease process will progress, limiting the benefit of any interventions.  Thank you for allowing [place] to participate in this patient's care.  Adele Barthel, MD [facility] Office: [phone] Pager: [phone]  01/05/2015, 10:17 AM

## 2015-01-12 ENCOUNTER — Other Ambulatory Visit: Payer: Self-pay

## 2015-01-12 ENCOUNTER — Ambulatory Visit (HOSPITAL_COMMUNITY): Payer: Medicare Other | Attending: Cardiovascular Disease

## 2015-01-12 DIAGNOSIS — R208 Other disturbances of skin sensation: Secondary | ICD-10-CM

## 2015-01-12 DIAGNOSIS — R200 Anesthesia of skin: Secondary | ICD-10-CM

## 2015-01-12 DIAGNOSIS — E785 Hyperlipidemia, unspecified: Secondary | ICD-10-CM | POA: Insufficient documentation

## 2015-01-12 DIAGNOSIS — G459 Transient cerebral ischemic attack, unspecified: Secondary | ICD-10-CM | POA: Diagnosis present

## 2015-01-12 DIAGNOSIS — I10 Essential (primary) hypertension: Secondary | ICD-10-CM | POA: Diagnosis not present

## 2015-01-12 DIAGNOSIS — E119 Type 2 diabetes mellitus without complications: Secondary | ICD-10-CM | POA: Diagnosis not present

## 2015-01-12 DIAGNOSIS — G458 Other transient cerebral ischemic attacks and related syndromes: Secondary | ICD-10-CM | POA: Diagnosis not present

## 2015-01-12 DIAGNOSIS — F17200 Nicotine dependence, unspecified, uncomplicated: Secondary | ICD-10-CM | POA: Diagnosis not present

## 2015-01-18 ENCOUNTER — Telehealth: Payer: Self-pay | Admitting: *Deleted

## 2015-01-18 NOTE — Telephone Encounter (Signed)
-----   Message from Penni Bombard, MD sent at 01/18/2015  9:52 AM EDT ----- pls call patient with echo results. No major problems. Continue current medications and follow up with PCP for management of stroke risk factors (hypertension, diabetes, lipids, smoking).

## 2015-01-18 NOTE — Telephone Encounter (Signed)
Left detailed vm for patient informing him of Dr Gladstone Lighter specific message and recommendations to FU with PCP for stroke risk factors. Left this caller's name, number for calll back if any questions.

## 2015-01-31 ENCOUNTER — Other Ambulatory Visit: Payer: Self-pay | Admitting: Anesthesiology

## 2015-01-31 DIAGNOSIS — Z978 Presence of other specified devices: Secondary | ICD-10-CM

## 2015-02-07 ENCOUNTER — Other Ambulatory Visit: Payer: Self-pay | Admitting: Anesthesiology

## 2015-02-07 ENCOUNTER — Ambulatory Visit
Admission: RE | Admit: 2015-02-07 | Discharge: 2015-02-07 | Disposition: A | Discharge status: Home / Self Care | Payer: Medicare Other | Source: Ambulatory Visit | Attending: Anesthesiology | Admitting: Anesthesiology

## 2015-02-07 DIAGNOSIS — Z978 Presence of other specified devices: Secondary | ICD-10-CM

## 2015-02-07 MED ORDER — DIAZEPAM 5 MG PO TABS
10.0000 mg | ORAL_TABLET | Freq: Once | ORAL | Status: AC
  Administered 2015-02-07: 10 mg via ORAL

## 2015-02-07 MED ORDER — IOHEXOL 300 MG/ML  SOLN
10.0000 mL | Freq: Once | INTRAMUSCULAR | Status: DC | PRN
  Administered 2015-02-07: 10 mL via INTRATHECAL

## 2015-02-07 NOTE — Progress Notes (Signed)
Pt states he has been off Trazodone and Phenergan for the past 2 days.

## 2015-02-07 NOTE — Discharge Instructions (Signed)
Myelogram Discharge Instructions  1. Go home and rest quietly for the next 24 hours.  It is important to lie flat for the next 24 hours.  Get up only to go to the restroom.  You may lie in the bed or on a couch on your back, your stomach, your left side or your right side.  You may have one pillow under your head.  You may have pillows between your knees while you are on your side or under your knees while you are on your back.  2. DO NOT drive today.  Recline the seat as far back as it will go, while still wearing your seat belt, on the way home.  3. You may get up to go to the bathroom as needed.  You may sit up for 10 minutes to eat.  You may resume your normal diet and medications unless otherwise indicated.  Drink lots of extra fluids today and tomorrow.  4. The incidence of headache, nausea, or vomiting is about 5% (one in 20 patients).  If you develop a headache, lie flat and drink plenty of fluids until the headache goes away.  Caffeinated beverages may be helpful.  If you develop severe nausea and vomiting or a headache that does not go away with flat bed rest, call [phone].  5. You may resume normal activities after your 24 hours of bed rest is over; however, do not exert yourself strongly or do any heavy lifting tomorrow. If when you get up you have a headache when standing, go back to bed and force fluids for another 24 hours.  6. Call your physician for a follow-up appointment.  The results of your myelogram will be sent directly to your physician by the following day.  7. If you have any questions or if complications develop after you arrive home, please call [phone].  Discharge instructions have been explained to the patient.  The patient, or the person responsible for the patient, fully understands these instructions.       May resume Trazodone and Promethazine on Sept. 22, 2016, after 9:30 am.

## 2015-02-21 ENCOUNTER — Ambulatory Visit (INDEPENDENT_AMBULATORY_CARE_PROVIDER_SITE_OTHER): Payer: Medicare Other | Admitting: Diagnostic Neuroimaging

## 2015-02-21 ENCOUNTER — Encounter: Payer: Self-pay | Admitting: Diagnostic Neuroimaging

## 2015-02-21 VITALS — BP 131/79 | HR 76 | Ht 70.0 in | Wt 282.0 lb

## 2015-02-21 DIAGNOSIS — R200 Anesthesia of skin: Secondary | ICD-10-CM

## 2015-02-21 DIAGNOSIS — I6522 Occlusion and stenosis of left carotid artery: Secondary | ICD-10-CM

## 2015-02-21 DIAGNOSIS — G458 Other transient cerebral ischemic attacks and related syndromes: Secondary | ICD-10-CM

## 2015-02-21 DIAGNOSIS — R208 Other disturbances of skin sensation: Secondary | ICD-10-CM

## 2015-02-21 DIAGNOSIS — R42 Dizziness and giddiness: Secondary | ICD-10-CM | POA: Diagnosis not present

## 2015-02-21 NOTE — Patient Instructions (Signed)
Thank you for coming to see [place] at [facility]. I hope we have been able to provide you high quality care today.  You may receive a patient satisfaction survey over the next few weeks. We would appreciate your feedback and comments so that we may continue to improve ourselves and the health of our patients.  - continue aspirin, statin, blood pressure medications - try to stop smoking - find ways to move your body gently, gradually; consider water therapy or other physical therapy   ~~~~~~~~~~~~~~~~~~~~~~~~~~~~~~~~~~~~~~~~~~~~~~~~~~~~~~~~~~~~~~~~~  DR. Markanthony Gedney'S GUIDE TO HAPPY AND HEALTHY LIVING These are some of my general health and wellness recommendations. Some of them may apply to you better than others. Please use common sense as you try these suggestions and feel free to ask me any questions.   ACTIVITY/FITNESS Mental, social, emotional and physical stimulation are very important for brain and body health. Try learning a new activity (arts, music, language, sports, games).  Keep moving your body to the best of your abilities. You can do this at home, inside or outside, the park, community center, gym or anywhere you like. Consider a physical therapist or personal trainer to get started. Consider the app Sworkit. Fitness trackers such as smart-watches, smart-phones or Fitbits can help as well.   NUTRITION Eat more plants: colorful vegetables, nuts, seeds and berries.  Eat less sugar, salt, preservatives and processed foods.  Avoid toxins such as cigarettes and alcohol.  Drink water when you are thirsty. Warm water with a slice of lemon is an excellent morning drink to start the day.  Consider these websites for more information The Nutrition Source (https://www.henry-hernandez.biz/) Precision Nutrition (WindowBlog.ch)   RELAXATION Consider practicing mindfulness meditation or other relaxation techniques such as  deep breathing, prayer, yoga, tai chi, massage. See website mindful.org or the apps Headspace or Calm to help get started.   SLEEP Try to get at least 7-8+ hours sleep per day. Regular exercise and reduced caffeine will help you sleep better. Practice good sleep hygeine techniques. See website sleep.org for more information.   PLANNING Prepare estate planning, living will, healthcare POA documents. Sometimes this is best planned with the help of an attorney. Theconversationproject.org and agingwithdignity.org are excellent resources.

## 2015-02-21 NOTE — Progress Notes (Signed)
[facility]  PATIENT: [name] DOB: 1956/01/21  REFERRING CLINICIAN: Harkins HISTORY FROM: patient  REASON FOR VISIT: follow up    HISTORICAL  CHIEF COMPLAINT:  Chief Complaint  Patient presents with  . Carotid stenosis    rm 6  . Follow-up    HISTORY OF PRESENT ILLNESS:   UPDATE 02/21/15: Since last visit, continues to have dizziness. Right face numbness improved. Has had vascular surgery consult, with repeat carotid u/s, that showed < 40% stenosis, and therefore medical mgmt was recommended.   UPDATE 01/04/15: Since last visit, continues to have dizziness and right face numbness, almost daily, but less severe and often than before.  PRIOR HPI (11/21/14): 59 year old male here for evaluation of dizziness. For past 2 months patient has had "swimmy headed" sensation, worse when standing, associated with blurred vision and nausea. No vertigo or lightheadedness. Some right facial numbness or tingling. No change in medications recently. Patient feels that his pain pump is leaking medication into his abdomen and this is causing his problem. Apparently this happened several years ago and has caused the same kind of problem now. He is planning to have additional testing to see if there is any fluid or medication leakage related to the pain pump.   REVIEW OF SYSTEMS: Full 14 system review of systems performed and notable only for aching muscles.   ALLERGIES: Allergies  Allergen Reactions  . Norvasc [Amlodipine Besylate] Swelling    HOME MEDICATIONS: Outpatient Prescriptions Prior to Visit  Medication Sig Dispense Refill  . aspirin 81 MG tablet Take 81 mg by mouth daily.    [name] atorvastatin (LIPITOR) 10 MG tablet Take 10 mg by mouth.    [name] b complex vitamins tablet Take 1 tablet by mouth daily.    . Bupivacaine HCl POWD by Does not apply route.    . diphenhydrAMINE (BENADRYL) 25 mg capsule Take by mouth.    . Fentanyl Citrate POWD by Does not apply  route.    . fluticasone (FLONASE) 50 MCG/ACT nasal spray Place 1 spray into the nose.    . gabapentin (NEURONTIN) 800 MG tablet     . hydrochlorothiazide (HYDRODIURIL) 25 MG tablet TAKE ONE TABLET BY MOUTH ONCE DAILY    . HYDROcodone-acetaminophen (NORCO/VICODIN) 5-325 MG per tablet as needed.    . hydromorphone, bulk, POWD by Does not apply route.    [name] lisinopril (PRINIVIL,ZESTRIL) 40 MG tablet TAKE ONE TABLET BY MOUTH ONCE DAILY    . omeprazole (PRILOSEC) 20 MG capsule Take 20 mg by mouth daily.    [name] oxyCODONE (ROXICODONE) 15 MG immediate release tablet Take 15 mg by mouth every 8 (eight) hours as needed for pain. For pain    . POTASSIUM GLUCONATE PO Take 1 tablet by mouth daily.    [name] PRESCRIPTION MEDICATION Inject into the skin continuous. This is a combination pain pump. Hydromorphone 7.5 mg/ml, Fentanyl 325 mcg/ml and Bupivacaine 3 mg/ml    . promethazine (PHENERGAN) 25 MG tablet     . traZODone (DESYREL) 150 MG tablet     . dicyclomine (BENTYL) 20 MG tablet Take 20 mg by mouth.     No facility-administered medications prior to visit.    PAST MEDICAL HISTORY: Past Medical History  Diagnosis Date  . Hypertension   . Diabetes mellitus     control with diet/does not check cbgs at home  . Arthritis     knees and back  . Sleep apnea     wears bipap  .  GERD (gastroesophageal reflux disease)   . Dizziness of unknown cause     when accessing the pump to refill for about week after    PAST SURGICAL HISTORY: Past Surgical History  Procedure Laterality Date  . Tonsillectomy    . Appendectomy    . Cholecystectomy    . Wrist arthroplasty      right = repair cartiledge  . Heel spurs      bilateral  . Hammer toe surgery    . Pain pump implantation    . Joint replacement      bilateral knees  . Muscle flap closure      skin grafting for muscle shifting  . Spinal cord stimulator insertion  12/23/2011    Procedure: LUMBAR SPINAL CORD STIMULATOR INSERTION;  Surgeon: [name],  MD;  Location: [place] [facility];  Service: Neurosurgery;  Laterality: N/A;  Implantable pulse generator change and pump revision  . Pain pump revision N/A 12/28/2012    Procedure: intrathecal Baclofen pump exploration with revision of catheter;  Surgeon: [name], MD;  Location: [place] [facility];  Service: Neurosurgery;  Laterality: N/A;  Baclofen pump exploration with [name] to assist    FAMILY HISTORY: Family History  Problem Relation Age of Onset  . Atrial fibrillation Mother   . Hypertension Mother   . Diabetes Father   . Hyperlipidemia Father   . Diabetes Brother   . Hypertension Brother     SOCIAL HISTORY:  Social History   Social History  . Marital Status: Married    Spouse Name: Fraser Din  . Number of Children: 1  . Years of Education: 12   Occupational History  . disabled     since 2011   Social History Main Topics  . Smoking status: Current Every Day Smoker -- 0.50 packs/day    Types: Cigarettes  . Smokeless tobacco: Never Used     Comment: 11/21/14 smoking off and on, occasionally, 02/21/15  . Alcohol Use: No  . Drug Use: No  . Sexual Activity: Not on file   Other Topics Concern  . Not on file   Social History Narrative   Married, on disability since 2011   Caffeine use - tea 3-4 glasses daily     PHYSICAL EXAM  GENERAL EXAM/CONSTITUTIONAL: Vitals:  Filed Vitals:   02/21/15 1123  BP: 131/79  Pulse: 76  Height: 5\' 10"  (1.778 m)  Weight: 282 lb (127.914 kg)   Body mass index is 40.46 kg/(m^2). No exam data present  Patient is in no distress; well developed, nourished and groomed; neck is supple  SCAR AND POST-SURGICAL CHANGES IN LEFT LOWER LEG  CARDIOVASCULAR:  Examination of carotid arteries is normal; no carotid bruits  Regular rate and rhythm, no murmurs  Examination of peripheral vascular system by observation and palpation is normal  EYES:  Ophthalmoscopic exam of optic discs and posterior segments is normal; no papilledema or  hemorrhages  MUSCULOSKELETAL:  Gait, strength, tone, movements noted in Neurologic exam below  NEUROLOGIC: MENTAL STATUS:  No flowsheet data found.  awake, alert, oriented to person, place and time  recent and remote memory intact  normal attention and concentration  language fluent, comprehension intact, naming intact,   fund of knowledge appropriate  CRANIAL NERVE:   2nd - no papilledema on fundoscopic exam  2nd, 3rd, 4th, 6th - pupils equal and reactive to light, visual fields full to confrontation, extraocular muscles intact, no nystagmus  5th - facial sensation symmetric  7th - facial  strength symmetric  8th - hearing intact  9th - palate elevates symmetrically, uvula midline  11th - shoulder shrug symmetric  12th - tongue protrusion midline  MOTOR:   normal bulk and tone, full strength in the BUE, BLE  SENSORY:   normal and symmetric to light touch, temperature, vibration  COORDINATION:   finger-nose-finger, fine finger movements normal  REFLEXES:   deep tendon reflexes TRACE and symmetric  GAIT/STATION:   narrow based gait; ROMBERG NEGATIVE    DIAGNOSTIC DATA (LABS, IMAGING, TESTING) - I reviewed patient records, labs, notes, testing and imaging myself where available.  Lab Results  Component Value Date   WBC 6.7 12/23/2012   HGB 13.5 12/23/2012   HCT 39.6 12/23/2012   MCV 85.2 12/23/2012   PLT 159 12/23/2012      Component Value Date/Time   NA 138 12/23/2012 1244   K 3.6 12/23/2012 1244   CL 101 12/23/2012 1244   CO2 27 12/23/2012 1244   GLUCOSE 92 12/23/2012 1244   BUN 12 12/23/2012 1244   CREATININE 0.85 12/23/2012 1244   CALCIUM 9.6 12/23/2012 1244   PROT 6.6 09/28/2008 1102   ALBUMIN 3.6 09/28/2008 1102   AST 45* 09/28/2008 1102   ALT 49 09/28/2008 1102   ALKPHOS 73 09/28/2008 1102   BILITOT 0.4 09/28/2008 1102   GFRNONAA >90 12/23/2012 1244   GFRAA >90 12/23/2012 1244   No results found for: CHOL, HDL, LDLCALC,  LDLDIRECT, TRIG, CHOLHDL No results found for: HGBA1C No results found for: VITAMINB12 No results found for: TSH   12/30/12 EKG [I reviewed images myself and agree with interpretation. -VRP]  - normal sinus rhythm  12/23/12 CXR [I reviewed images myself and agree with interpretation. -VRP]  - No acute cardiopulmonary disease identified.  11/27/14 CT head [I reviewed images myself and agree with interpretation. -VRP]  - normal  12/06/14 carotid u/s  - 50-69% stenosis of mid left ICA with moderate focal plaque - bilateral vertebral arteries are ansterograde  01/05/15 carotid u/s - less than 40% stenosis in bilateral ICA  01/12/15 TTE  - Left ventricle: The cavity size was normal. Systolic function was normal. The estimated ejection fraction was in the range of 60% to 65%. Wall motion was normal; there were no regional wall motion abnormalities. - Atrial septum: There was increased thickness of the septum, consistent with lipomatous hypertrophy. No defect or patent foramen ovale was identified.    ASSESSMENT AND PLAN  59 y.o. year old male here with intermittent dizziness, nausea, blurred vision, right facial numbness since May 2016. Also with left ICA stenosis 50-69%, which I consider symptomatic (causing right face numbness TIAs). Follow up carotid u/s showed less stenosis (<40%). Dizziness spells are non-specific, and may be medication side effect, metabolic or something else non-specific. Patient cannot have MRI due to spinal cord stimulator (from ~2000; not MRI compatible).    Dx:  Carotid stenosis, left  Other specified transient cerebral ischemias  Right facial numbness  Dizziness and giddiness    PLAN: - follow up with PCP for medical mgmt for stroke risk reduction (aspirin, BP control, statin, monitoring for diabetes, smoking cessation, OSA treatment with bipap)  Return if symptoms worsen or fail to improve, for return to PCP.    Penni Bombard, MD 92/05/1939,  74:08 AM Certified in Neurology, Neurophysiology and Neuroimaging  [facility] [address], [place], [place] [address] [phone]

## 2015-03-20 ENCOUNTER — Other Ambulatory Visit (HOSPITAL_COMMUNITY): Payer: Self-pay | Admitting: Anesthesiology

## 2015-04-03 ENCOUNTER — Encounter (HOSPITAL_COMMUNITY)
Admission: RE | Admit: 2015-04-03 | Discharge: 2015-04-03 | Disposition: A | Discharge status: Home / Self Care | Payer: Medicare Other | Source: Ambulatory Visit | Attending: Anesthesiology | Admitting: Anesthesiology

## 2015-04-03 ENCOUNTER — Encounter (HOSPITAL_COMMUNITY): Payer: Self-pay

## 2015-04-03 DIAGNOSIS — Y828 Other medical devices associated with adverse incidents: Secondary | ICD-10-CM | POA: Diagnosis not present

## 2015-04-03 DIAGNOSIS — M13861 Other specified arthritis, right knee: Secondary | ICD-10-CM | POA: Diagnosis not present

## 2015-04-03 DIAGNOSIS — Y9289 Other specified places as the place of occurrence of the external cause: Secondary | ICD-10-CM | POA: Diagnosis not present

## 2015-04-03 DIAGNOSIS — Z9889 Other specified postprocedural states: Secondary | ICD-10-CM | POA: Diagnosis not present

## 2015-04-03 DIAGNOSIS — G894 Chronic pain syndrome: Secondary | ICD-10-CM | POA: Diagnosis not present

## 2015-04-03 DIAGNOSIS — I10 Essential (primary) hypertension: Secondary | ICD-10-CM | POA: Diagnosis not present

## 2015-04-03 DIAGNOSIS — E1351 Other specified diabetes mellitus with diabetic peripheral angiopathy without gangrene: Secondary | ICD-10-CM | POA: Diagnosis not present

## 2015-04-03 DIAGNOSIS — T85695A Other mechanical complication of other nervous system device, implant or graft, initial encounter: Secondary | ICD-10-CM | POA: Diagnosis not present

## 2015-04-03 DIAGNOSIS — G4730 Sleep apnea, unspecified: Secondary | ICD-10-CM | POA: Diagnosis not present

## 2015-04-03 DIAGNOSIS — M549 Dorsalgia, unspecified: Secondary | ICD-10-CM | POA: Diagnosis not present

## 2015-04-03 DIAGNOSIS — K219 Gastro-esophageal reflux disease without esophagitis: Secondary | ICD-10-CM | POA: Diagnosis not present

## 2015-04-03 DIAGNOSIS — F329 Major depressive disorder, single episode, unspecified: Secondary | ICD-10-CM | POA: Diagnosis not present

## 2015-04-03 DIAGNOSIS — F17210 Nicotine dependence, cigarettes, uncomplicated: Secondary | ICD-10-CM | POA: Diagnosis not present

## 2015-04-03 DIAGNOSIS — D649 Anemia, unspecified: Secondary | ICD-10-CM | POA: Diagnosis not present

## 2015-04-03 DIAGNOSIS — M4680 Other specified inflammatory spondylopathies, site unspecified: Secondary | ICD-10-CM | POA: Diagnosis not present

## 2015-04-03 DIAGNOSIS — M13862 Other specified arthritis, left knee: Secondary | ICD-10-CM | POA: Diagnosis not present

## 2015-04-03 DIAGNOSIS — Z7982 Long term (current) use of aspirin: Secondary | ICD-10-CM | POA: Diagnosis not present

## 2015-04-03 DIAGNOSIS — Y838 Other surgical procedures as the cause of abnormal reaction of the patient, or of later complication, without mention of misadventure at the time of the procedure: Secondary | ICD-10-CM | POA: Diagnosis not present

## 2015-04-03 DIAGNOSIS — Z888 Allergy status to other drugs, medicaments and biological substances status: Secondary | ICD-10-CM | POA: Diagnosis not present

## 2015-04-03 HISTORY — DX: Other chronic pain: G89.29

## 2015-04-03 HISTORY — DX: Anesthesia of skin: R20.0

## 2015-04-03 HISTORY — DX: Depression, unspecified: F32.A

## 2015-04-03 HISTORY — DX: Headache: R51

## 2015-04-03 HISTORY — DX: Major depressive disorder, single episode, unspecified: F32.9

## 2015-04-03 HISTORY — DX: Personal history of other diseases of the respiratory system: Z87.09

## 2015-04-03 HISTORY — DX: Dorsalgia, unspecified: M54.9

## 2015-04-03 HISTORY — DX: Personal history of other diseases of the nervous system and sense organs: Z86.69

## 2015-04-03 HISTORY — DX: Paresthesia of skin: R20.2

## 2015-04-03 HISTORY — DX: Headache, unspecified: R51.9

## 2015-04-03 HISTORY — DX: Anemia, unspecified: D64.9

## 2015-04-03 LAB — CBC
HCT: 40.1 % (ref 39.0–52.0)
HEMOGLOBIN: 13.3 g/dL (ref 13.0–17.0)
MCH: 28.9 pg (ref 26.0–34.0)
MCHC: 33.2 g/dL (ref 30.0–36.0)
MCV: 87.2 fL (ref 78.0–100.0)
PLATELETS: 134 10*3/uL — AB (ref 150–400)
RBC: 4.6 MIL/uL (ref 4.22–5.81)
RDW: 14.5 % (ref 11.5–15.5)
WBC: 6 10*3/uL (ref 4.0–10.5)

## 2015-04-03 LAB — BASIC METABOLIC PANEL
Anion gap: 9 (ref 5–15)
BUN: 14 mg/dL (ref 6–20)
CO2: 24 mmol/L (ref 22–32)
Calcium: 9.3 mg/dL (ref 8.9–10.3)
Chloride: 105 mmol/L (ref 101–111)
Creatinine, Ser: 0.89 mg/dL (ref 0.61–1.24)
GFR calc Af Amer: >60 mL/min (ref >60)
GFR calc non Af Amer: >60 mL/min (ref >60)
Glucose, Bld: 109 mg/dL — ABNORMAL HIGH (ref 65–99)
Potassium: 3.3 mmol/L — ABNORMAL LOW (ref 3.5–5.1)
Sodium: 138 mmol/L (ref 135–145)

## 2015-04-03 LAB — GLUCOSE, CAPILLARY: Glucose-Capillary: 134 mg/dL — ABNORMAL HIGH (ref 65–99)

## 2015-04-03 LAB — SURGICAL PCR SCREEN
MRSA, PCR: NEGATIVE
Staphylococcus aureus: NEGATIVE

## 2015-04-03 NOTE — Pre-Procedure Instructions (Signed)
    [name]  04/03/2015      [facility] [address], [place], [id number] [name] [address] Phone: [phone] Fax: [phone]    Your procedure is scheduled on Friday, November 18th, 2016.  Report to [facility] Admitting at 7:30 A.M.  Call this number if you have problems the morning of surgery:  [phone]   Remember:  Do not eat food or drink liquids after midnight.   Take these medicines the morning of surgery with A SIP OF WATER: Dicyclomine (Bentyl) if needed, Diphenhydramine (Benadryl) if needed, Fluticasone (Flonase) if needed, Oxycodone (Roxicodone) if needed, Promethazine (Phenergan) if needed.   Stop taking: Aspirin, NSAIDS, Aleve, Naproxen, Ibuprofen, Advil, Motrin, BC's, Goody's, fish oil, all herbal medications, and all vitamins.    Do not wear jewelry.  Do not wear lotions, powders, or colognes.  You may NOT wear deodorant.  Men may shave face and neck.  Do not bring valuables to the hospital.  [facility] is not responsible for any belongings or valuables.  Contacts, dentures or bridgework may not be worn into surgery.  Leave your suitcase in the car.  After surgery it may be brought to your room.  For patients admitted to the hospital, discharge time will be determined by your treatment team.  Patients discharged the day of surgery will not be allowed to drive home.   Special instructions:  See attached.   Please read over the following fact sheets that you were given. Pain Booklet, Coughing and Deep Breathing, MRSA Information and Surgical Site Infection Prevention      How to Manage Your Diabetes Before Surgery   Why is it important to control my blood sugar before and after surgery?   Improving blood sugar levels before and after surgery helps healing and can limit problems.  A way of improving blood sugar control is eating a healthy diet by:  - Eating less  sugar and carbohydrates  - Increasing activity/exercise  - Talk with your doctor about reaching your blood sugar goals  High blood sugars (greater than 180 mg/dL) can raise your risk of infections and slow down your recovery so you will need to focus on controlling your diabetes during the weeks before surgery.  Make sure that the doctor who takes care of your diabetes knows about your planned surgery including the date and location.  How do I manage my blood sugars before surgery?   Check your blood sugar at least 4 times a day, 2 days before surgery to make sure that they are not too high or low.   Check your blood sugar the morning of your surgery when you wake up and every 2 hours until you get to the Short-Stay unit.  If your blood sugar is less than 70 mg/dL, you will need to treat for low blood sugar by:  Treat a low blood sugar (less than 70 mg/dL) with 1/2 cup of clear juice (cranberry or apple), 4 glucose tablets, OR glucose gel.  Recheck blood sugar in 15 minutes after treatment (to make sure it is greater than 70 mg/dL).  If blood sugar is not greater than 70 mg/dL on re-check, call [phone] for further instructions.   Report your blood sugar to the Short-Stay nurse when you get to Short-Stay.  References:  [facility], 2007 "How to Manage your Diabetes Before and After Surgery".

## 2015-04-03 NOTE — Progress Notes (Signed)
PCP - Dr. Alma Friendly Cardiologist - Dr. Helene Kelp  EKG- 10/2014 - Care everywhere - requested tracing CXR - denies  Echo - 09/2013 - care everywhere Stress test - 10/2013 - care everywhere Cardiac cath  - denies  Patient denies chest pain and shortness of breath at PAT appointment.

## 2015-04-03 NOTE — Progress Notes (Signed)
Patient states that diabetes is diet controlled and that he does not check his blood sugar at home, he does not have a CBG machine.    Patient states that he has had a sleep study completed in 2016 and that he wears a CPAP at night.  Patient verbalizes that he will bring CPAP mask day of surgery.

## 2015-04-04 LAB — HEMOGLOBIN A1C
HEMOGLOBIN A1C: 6.1 % — AB (ref 4.8–5.6)
MEAN PLASMA GLUCOSE: 128 mg/dL

## 2015-04-05 MED ORDER — DEXTROSE 5 % IV SOLN
3.0000 g | INTRAVENOUS | Status: AC
  Administered 2015-04-06: 3 g via INTRAVENOUS
  Filled 2015-04-05 (×2): qty 3000

## 2015-04-05 NOTE — H&P (Signed)
[name] is an 59 y.o. male.   Chief Complaint: pain/discomfort around IT pump, inefficacy of IT drug therapy HPI: [name] had an intrathecal drug delivery device placed several years ago, for chronic pain related to multiple traumatic injuries. In August 2013 he underwent replacement of that device, with the battery in intrathecal pump itself having expired.  I first met him in 2014, when he was having perceived a difficulty with drug delivery.  We ultimately took him to the operating room to explore his pump, and found a fractured catheter just at the junction with the side port of the pump itself.  We revise the catheter, and he has been doing fairly well up until about the last 5-6 months.  There was no inciting event, or trauma, when the patient began to report that he felt "dizzy, not right, and started experiencing diffuse discomfort around his intrathecal pump itself.  We have as extensively as possible evaluated the pump itself, including a pump program, ultrasound evaluation of the pump pocket and studies to evaluate for granuloma or other disruption of the catheter or drug delivery device itself.  None of those tests as indicated that there is any problem with the pump or the catheter.   Given his previous problems, however, [name] has been fairly adamant about exploring the pump surgically, and we have agreed that if we can find nothing technically wrong with the pump is best to go ahead and remove it.  If we do identify either a pump, or catheter problem, we'll do our best to rectify it in the OR.  Past Medical History  Diagnosis Date  . Hypertension   . Arthritis     knees and back  . GERD (gastroesophageal reflux disease)   . Dizziness of unknown cause     when accessing the pump to refill for about week after  . Sleep apnea     wears CPAP  . Diabetes mellitus     control with diet/does not check cbgs at home  . Numbness and tingling of both legs   . History of  Bell's palsy   . History of bronchitis   . Depression   . Headache     occassional migraines  . Anemia     per patient  . Chronic back pain     Past Surgical History  Procedure Laterality Date  . Tonsillectomy    . Appendectomy    . Cholecystectomy    . Wrist arthroplasty      right = repair cartiledge  . Heel spurs      bilateral  . Hammer toe surgery Bilateral   . Pain pump implantation    . Muscle flap closure      skin grafting for muscle shifting  . Spinal cord stimulator insertion  12/23/2011    Procedure: LUMBAR SPINAL CORD STIMULATOR INSERTION;  Surgeon: [name], MD;  Location: [place] [facility];  Service: Neurosurgery;  Laterality: N/A;  Implantable pulse generator change and pump revision  . Pain pump revision N/A 12/28/2012    Procedure: intrathecal Baclofen pump exploration with revision of catheter;  Surgeon: [name], MD;  Location: [place] [facility];  Service: Neurosurgery;  Laterality: N/A;  Baclofen pump exploration with [name] to assist  . Joint replacement      bilateral knees  . Knee arthroscopy Bilateral     "multiple knee surgeries"   . Uvulopalatopharyngoplasty    . Colonoscopy      Family  History  Problem Relation Age of Onset  . Atrial fibrillation Mother   . Hypertension Mother   . Diabetes Father   . Hyperlipidemia Father   . Diabetes Brother   . Hypertension Brother    Social History:  reports that he has been smoking Cigarettes.  He has been smoking about 0.50 packs per day. He has never used smokeless tobacco. He reports that he does not drink alcohol or use illicit drugs.  Allergies:  Allergies  Allergen Reactions  . Norvasc [Amlodipine Besylate] Swelling    Medications Prior to Admission  Medication Sig Dispense Refill  . aspirin 81 MG tablet Take 81 mg by mouth daily.    [name] atorvastatin (LIPITOR) 10 MG tablet Take 10 mg by mouth daily.     [name] b complex vitamins tablet Take 1 tablet by mouth daily.    . Bupivacaine HCl POWD 1  each by Does not apply route See admin instructions. In pain pump    . dicyclomine (BENTYL) 20 MG tablet Take 20 mg by mouth daily as needed for spasms.     . diphenhydrAMINE (BENADRYL) 25 mg capsule Take 25 mg by mouth daily as needed for allergies.     . Fentanyl Citrate POWD 1 each by Does not apply route See admin instructions. In pain pump    . fluticasone (FLONASE) 50 MCG/ACT nasal spray Place 2 sprays into both nostrils daily as needed for allergies.     [name] gabapentin (NEURONTIN) 800 MG tablet Take 400 mg by mouth at bedtime.     . hydrochlorothiazide (HYDRODIURIL) 25 MG tablet Take 25 mg by mouth once daily    . hydromorphone, bulk, POWD 1 each by Does not apply route See admin instructions. In pain pump    . lisinopril (PRINIVIL,ZESTRIL) 40 MG tablet Take 40 mg by mouth once daily    . omeprazole (PRILOSEC) 20 MG capsule Take 20 mg by mouth daily.    [name] oxyCODONE (ROXICODONE) 15 MG immediate release tablet Take 15 mg by mouth every 8 (eight) hours as needed for pain.     [name] POTASSIUM GLUCONATE PO Take 1 tablet by mouth daily.    [name] PRESCRIPTION MEDICATION Inject into the skin continuous. This is a combination pain pump. Hydromorphone 7.5 mg/ml, Fentanyl 325 mcg/ml and Bupivacaine 3 mg/ml    . promethazine (PHENERGAN) 25 MG tablet Take 25 mg by mouth every 6 (six) hours as needed for nausea or vomiting.     . traZODone (DESYREL) 150 MG tablet Take 150 mg by mouth at bedtime.       Results for orders placed or performed during the hospital encounter of 04/06/15 (from the past 48 hour(s))  Glucose, capillary     Status: None   Collection Time: 04/06/15  8:14 AM  Result Value Ref Range   Glucose-Capillary 89 65 - 99 mg/dL   No results found.  Review of Systems  Constitutional: Negative.   HENT: Negative.   Eyes: Positive for blurred vision. Negative for double vision, photophobia, discharge and redness.  Respiratory: Negative.   Cardiovascular: Negative.   Gastrointestinal: Negative.    Musculoskeletal: Positive for myalgias and joint pain. Negative for falls and neck pain.  Skin: Negative.   Neurological: Positive for dizziness. Negative for tingling, tremors, sensory change, speech change, focal weakness, seizures and loss of consciousness.  Psychiatric/Behavioral: Negative.     There were no vitals taken for this visit. Physical Exam  Constitutional: He is oriented to person, place, and time.  He appears well-developed and well-nourished.  HENT:  Head: Normocephalic and atraumatic.  Eyes: Conjunctivae are normal. Pupils are equal, round, and reactive to light.  Cardiovascular: Normal rate and regular rhythm.   Neurological: He is alert and oriented to person, place, and time.  Skin: Skin is warm and dry.  Psychiatric: He has a normal mood and affect. His behavior is normal. Thought content normal.     Assessment/Plan A) chronic posttraumatic pain, question of malfunction in IT pump P) surgical expiration of IT pump pocket, catheter, with either revision or removal  [name] 04/06/2015, 10:51 AM

## 2015-04-06 ENCOUNTER — Ambulatory Visit (HOSPITAL_COMMUNITY): Payer: Medicare Other | Admitting: Anesthesiology

## 2015-04-06 ENCOUNTER — Encounter (HOSPITAL_COMMUNITY): Payer: Self-pay | Admitting: *Deleted

## 2015-04-06 ENCOUNTER — Ambulatory Visit (HOSPITAL_COMMUNITY)
Admission: RE | Admit: 2015-04-06 | Discharge: 2015-04-06 | Disposition: A | Discharge status: Home / Self Care | Payer: Medicare Other | Source: Ambulatory Visit | Attending: Anesthesiology | Admitting: Anesthesiology

## 2015-04-06 ENCOUNTER — Encounter (HOSPITAL_COMMUNITY)
Admission: RE | Disposition: A | Discharge status: Home / Self Care | Payer: Self-pay | Source: Ambulatory Visit | Attending: Anesthesiology

## 2015-04-06 ENCOUNTER — Ambulatory Visit (HOSPITAL_COMMUNITY): Payer: Medicare Other

## 2015-04-06 DIAGNOSIS — F17210 Nicotine dependence, cigarettes, uncomplicated: Secondary | ICD-10-CM | POA: Insufficient documentation

## 2015-04-06 DIAGNOSIS — M4680 Other specified inflammatory spondylopathies, site unspecified: Secondary | ICD-10-CM | POA: Insufficient documentation

## 2015-04-06 DIAGNOSIS — M549 Dorsalgia, unspecified: Secondary | ICD-10-CM | POA: Insufficient documentation

## 2015-04-06 DIAGNOSIS — Z888 Allergy status to other drugs, medicaments and biological substances status: Secondary | ICD-10-CM | POA: Insufficient documentation

## 2015-04-06 DIAGNOSIS — G894 Chronic pain syndrome: Secondary | ICD-10-CM | POA: Insufficient documentation

## 2015-04-06 DIAGNOSIS — M13862 Other specified arthritis, left knee: Secondary | ICD-10-CM | POA: Insufficient documentation

## 2015-04-06 DIAGNOSIS — T85695A Other mechanical complication of other nervous system device, implant or graft, initial encounter: Secondary | ICD-10-CM | POA: Insufficient documentation

## 2015-04-06 DIAGNOSIS — Y828 Other medical devices associated with adverse incidents: Secondary | ICD-10-CM | POA: Insufficient documentation

## 2015-04-06 DIAGNOSIS — Y9289 Other specified places as the place of occurrence of the external cause: Secondary | ICD-10-CM | POA: Insufficient documentation

## 2015-04-06 DIAGNOSIS — G4730 Sleep apnea, unspecified: Secondary | ICD-10-CM | POA: Insufficient documentation

## 2015-04-06 DIAGNOSIS — M13861 Other specified arthritis, right knee: Secondary | ICD-10-CM | POA: Insufficient documentation

## 2015-04-06 DIAGNOSIS — Z9889 Other specified postprocedural states: Secondary | ICD-10-CM | POA: Insufficient documentation

## 2015-04-06 DIAGNOSIS — D649 Anemia, unspecified: Secondary | ICD-10-CM | POA: Insufficient documentation

## 2015-04-06 DIAGNOSIS — E1351 Other specified diabetes mellitus with diabetic peripheral angiopathy without gangrene: Secondary | ICD-10-CM | POA: Insufficient documentation

## 2015-04-06 DIAGNOSIS — I10 Essential (primary) hypertension: Secondary | ICD-10-CM | POA: Insufficient documentation

## 2015-04-06 DIAGNOSIS — Z7982 Long term (current) use of aspirin: Secondary | ICD-10-CM | POA: Insufficient documentation

## 2015-04-06 DIAGNOSIS — Y838 Other surgical procedures as the cause of abnormal reaction of the patient, or of later complication, without mention of misadventure at the time of the procedure: Secondary | ICD-10-CM | POA: Insufficient documentation

## 2015-04-06 DIAGNOSIS — K219 Gastro-esophageal reflux disease without esophagitis: Secondary | ICD-10-CM | POA: Insufficient documentation

## 2015-04-06 DIAGNOSIS — F329 Major depressive disorder, single episode, unspecified: Secondary | ICD-10-CM | POA: Insufficient documentation

## 2015-04-06 HISTORY — PX: PAIN PUMP REVISION: SHX6231

## 2015-04-06 LAB — GLUCOSE, CAPILLARY
Glucose-Capillary: 89 mg/dL (ref 65–99)
Glucose-Capillary: 93 mg/dL (ref 65–99)

## 2015-04-06 SURGERY — PAIN PUMP REVISION
Anesthesia: General | Site: Back

## 2015-04-06 MED ORDER — LIDOCAINE HCL (CARDIAC) 20 MG/ML IV SOLN
INTRAVENOUS | Status: DC | PRN
  Administered 2015-04-06: 100 mg via INTRAVENOUS

## 2015-04-06 MED ORDER — ROCURONIUM BROMIDE 50 MG/5ML IV SOLN
INTRAVENOUS | Status: AC
  Filled 2015-04-06: qty 2

## 2015-04-06 MED ORDER — ARTIFICIAL TEARS OP OINT
TOPICAL_OINTMENT | OPHTHALMIC | Status: AC
  Filled 2015-04-06: qty 7

## 2015-04-06 MED ORDER — EPHEDRINE SULFATE 50 MG/ML IJ SOLN
INTRAMUSCULAR | Status: AC
  Filled 2015-04-06: qty 1

## 2015-04-06 MED ORDER — GLYCOPYRROLATE 0.2 MG/ML IJ SOLN
INTRAMUSCULAR | Status: DC | PRN
  Administered 2015-04-06: 0.6 mg via INTRAVENOUS

## 2015-04-06 MED ORDER — HYDROMORPHONE HCL 1 MG/ML IJ SOLN
INTRAMUSCULAR | Status: AC
  Filled 2015-04-06: qty 1

## 2015-04-06 MED ORDER — PROPOFOL 10 MG/ML IV BOLUS
INTRAVENOUS | Status: AC
  Filled 2015-04-06: qty 20

## 2015-04-06 MED ORDER — ONDANSETRON HCL 4 MG/2ML IJ SOLN
INTRAMUSCULAR | Status: AC
  Filled 2015-04-06: qty 2

## 2015-04-06 MED ORDER — ARTIFICIAL TEARS OP OINT
TOPICAL_OINTMENT | OPHTHALMIC | Status: DC | PRN
Start: 2015-04-06 — End: 2015-04-06
  Administered 2015-04-06: 1 via OPHTHALMIC

## 2015-04-06 MED ORDER — PROPOFOL 10 MG/ML IV BOLUS
INTRAVENOUS | Status: DC | PRN
  Administered 2015-04-06: 160 mg via INTRAVENOUS

## 2015-04-06 MED ORDER — ONDANSETRON HCL 4 MG/2ML IJ SOLN
INTRAMUSCULAR | Status: DC | PRN
  Administered 2015-04-06: 4 mg via INTRAVENOUS

## 2015-04-06 MED ORDER — LACTATED RINGERS IV SOLN
INTRAVENOUS | Status: DC
  Administered 2015-04-06 (×3): via INTRAVENOUS

## 2015-04-06 MED ORDER — PHENYLEPHRINE HCL 10 MG/ML IJ SOLN
INTRAMUSCULAR | Status: DC | PRN
  Administered 2015-04-06: 80 ug via INTRAVENOUS

## 2015-04-06 MED ORDER — NEOSTIGMINE METHYLSULFATE 10 MG/10ML IV SOLN
INTRAVENOUS | Status: DC | PRN
  Administered 2015-04-06: 4 mg via INTRAVENOUS

## 2015-04-06 MED ORDER — MEPERIDINE HCL 25 MG/ML IJ SOLN: 6.2500 mg | INTRAMUSCULAR | Status: DC | PRN

## 2015-04-06 MED ORDER — ROCURONIUM BROMIDE 100 MG/10ML IV SOLN
INTRAVENOUS | Status: DC | PRN
  Administered 2015-04-06: 40 mg via INTRAVENOUS

## 2015-04-06 MED ORDER — OXYCODONE HCL 15 MG PO TABS: 15.0000 mg | ORAL_TABLET | ORAL | Status: DC | PRN

## 2015-04-06 MED ORDER — SODIUM CHLORIDE 0.9 % IR SOLN
Status: DC | PRN
  Administered 2015-04-06: 500 mL

## 2015-04-06 MED ORDER — MIDAZOLAM HCL 2 MG/2ML IJ SOLN
INTRAMUSCULAR | Status: AC
  Filled 2015-04-06: qty 2

## 2015-04-06 MED ORDER — FENTANYL CITRATE (PF) 250 MCG/5ML IJ SOLN
INTRAMUSCULAR | Status: AC
  Filled 2015-04-06: qty 5

## 2015-04-06 MED ORDER — FENTANYL CITRATE (PF) 100 MCG/2ML IJ SOLN
INTRAMUSCULAR | Status: DC | PRN
  Administered 2015-04-06 (×2): 50 ug via INTRAVENOUS
  Administered 2015-04-06: 100 ug via INTRAVENOUS
  Administered 2015-04-06: 50 ug via INTRAVENOUS

## 2015-04-06 MED ORDER — SODIUM CHLORIDE 0.9 % IJ SOLN
INTRAMUSCULAR | Status: AC
  Filled 2015-04-06: qty 10

## 2015-04-06 MED ORDER — HYDROMORPHONE HCL 1 MG/ML IJ SOLN
0.2500 mg | INTRAMUSCULAR | Status: DC | PRN
  Administered 2015-04-06 (×4): 0.5 mg via INTRAVENOUS

## 2015-04-06 MED ORDER — LIDOCAINE HCL (CARDIAC) 20 MG/ML IV SOLN
INTRAVENOUS | Status: AC
  Filled 2015-04-06: qty 15

## 2015-04-06 MED ORDER — 0.9 % SODIUM CHLORIDE (POUR BTL) OPTIME
TOPICAL | Status: DC | PRN
  Administered 2015-04-06: 1000 mL

## 2015-04-06 MED ORDER — MIDAZOLAM HCL 5 MG/5ML IJ SOLN
INTRAMUSCULAR | Status: DC | PRN
  Administered 2015-04-06: 2 mg via INTRAVENOUS

## 2015-04-06 MED ORDER — PROMETHAZINE HCL 25 MG/ML IJ SOLN: 6.2500 mg | INTRAMUSCULAR | Status: DC | PRN

## 2015-04-06 SURGICAL SUPPLY — 52 items
BAG DECANTER FOR FLEXI CONT (MISCELLANEOUS) ×2 IMPLANT
BINDER ABD UNIV 12 45-62 (WOUND CARE) IMPLANT
BINDER ABDOMINAL 46IN 62IN (WOUND CARE)
BLADE CLIPPER SURG (BLADE) IMPLANT
BOOT SUTURE AID YELLOW STND (SUTURE) IMPLANT
CHLORAPREP W/TINT 26ML (MISCELLANEOUS) ×2 IMPLANT
CLIP TI MEDIUM 6 (CLIP) ×2 IMPLANT
DRAPE LAPAROTOMY 100X72X124 (DRAPES) ×2 IMPLANT
DRAPE POUCH INSTRU U-SHP 10X18 (DRAPES) ×2 IMPLANT
DRAPE SURG 17X23 STRL (DRAPES) ×8 IMPLANT
DRSG COVADERM 4X8 (GAUZE/BANDAGES/DRESSINGS) ×2 IMPLANT
DRSG OPSITE POSTOP 4X6 (GAUZE/BANDAGES/DRESSINGS) ×4 IMPLANT
ELECT REM PT RETURN 9FT ADLT (ELECTROSURGICAL) ×2
ELECTRODE REM PT RTRN 9FT ADLT (ELECTROSURGICAL) ×1 IMPLANT
GAUZE SPONGE 4X4 16PLY XRAY LF (GAUZE/BANDAGES/DRESSINGS) IMPLANT
GLOVE BIO SURGEON STRL SZ7 (GLOVE) ×2 IMPLANT
GLOVE BIOGEL PI IND STRL 7.5 (GLOVE) ×1 IMPLANT
GLOVE BIOGEL PI INDICATOR 7.5 (GLOVE) ×1
GLOVE ECLIPSE 7.5 STRL STRAW (GLOVE) ×2 IMPLANT
GLOVE EXAM NITRILE LRG STRL (GLOVE) IMPLANT
GLOVE EXAM NITRILE MD LF STRL (GLOVE) IMPLANT
GLOVE EXAM NITRILE XL STR (GLOVE) IMPLANT
GLOVE EXAM NITRILE XS STR PU (GLOVE) IMPLANT
GOWN STRL REUS W/ TWL LRG LVL3 (GOWN DISPOSABLE) ×1 IMPLANT
GOWN STRL REUS W/ TWL XL LVL3 (GOWN DISPOSABLE) IMPLANT
GOWN STRL REUS W/TWL 2XL LVL3 (GOWN DISPOSABLE) IMPLANT
GOWN STRL REUS W/TWL LRG LVL3 (GOWN DISPOSABLE) ×1
GOWN STRL REUS W/TWL XL LVL3 (GOWN DISPOSABLE)
KIT BASIN OR (CUSTOM PROCEDURE TRAY) ×2 IMPLANT
KIT CATHETER ACCESS PORT (KITS) ×2 IMPLANT
KIT ROOM TURNOVER OR (KITS) ×2 IMPLANT
LIQUID BAND (GAUZE/BANDAGES/DRESSINGS) IMPLANT
NEEDLE 18GX1X1/2 (RX/OR ONLY) (NEEDLE) IMPLANT
NEEDLE HYPO 25X1 1.5 SAFETY (NEEDLE) ×2 IMPLANT
NS IRRIG 1000ML POUR BTL (IV SOLUTION) ×2 IMPLANT
PACK LAMINECTOMY NEURO (CUSTOM PROCEDURE TRAY) ×2 IMPLANT
PAD ARMBOARD 7.5X6 YLW CONV (MISCELLANEOUS) ×2 IMPLANT
SPONGE LAP 4X18 X RAY DECT (DISPOSABLE) IMPLANT
SPONGE SURGIFOAM ABS GEL SZ50 (HEMOSTASIS) IMPLANT
STAPLER SKIN PROX WIDE 3.9 (STAPLE) ×4 IMPLANT
SUT MNCRL AB 4-0 PS2 18 (SUTURE) ×2 IMPLANT
SUT SILK 0 (SUTURE) ×1
SUT SILK 0 MO-6 18XCR BRD 8 (SUTURE) ×1 IMPLANT
SUT SILK 2 0 FS (SUTURE) ×4 IMPLANT
SUT VIC AB 0 CT1 18XCR BRD8 (SUTURE) ×1 IMPLANT
SUT VIC AB 0 CT1 8-18 (SUTURE) ×1
SUT VIC AB 2-0 CP2 18 (SUTURE) ×4 IMPLANT
SYR 3ML LL SCALE MARK (SYRINGE) ×2 IMPLANT
TOWEL OR 17X24 6PK STRL BLUE (TOWEL DISPOSABLE) ×2 IMPLANT
TOWEL OR 17X26 10 PK STRL BLUE (TOWEL DISPOSABLE) ×2 IMPLANT
WATER STERILE IRR 1000ML POUR (IV SOLUTION) ×2 IMPLANT
YANKAUER SUCT BULB TIP NO VENT (SUCTIONS) ×4 IMPLANT

## 2015-04-06 NOTE — Op Note (Signed)
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 2) Malfunctioning IT pump 3) chronic pain syndrome 4) chronic post-traumatic pain POSTOP DX:1) failure of IT therapy. 2) Malfunctioning IT pump 3) chronic pain syndrome 4) chronic post-traumatic pain  PROCEDURES: 1) exploration of IT pump 2) catheter dye study e.g "pumpogram" 3) removal of IT catheter 4) removal of IT pump/drug delivery device  SURGEON: Wells Mabe ASSISTANT: none ANESTHESIA: GETA  FINDINGS: normal pumpogram, intact catheter, expected quantity of drug in IT pump reservoir EBL: <10cc  DESCRIPTION OF PROCEDURE: After a discussion of risks, benefits and alternatives, informed consent was obtained. The patient was taken to the OR, a plane of general anesthesia induced, and the patient intubated by the anesthesiology staff. The patient was then positioned in a right lateral decubitus position on the OR table, an axillary roll placed, and extreme care taken to position the patient so that all pressure points were padded, limbs and head all in a neutral position. The patient's abdomen, flank and lumbar spine were prepped with chloraprep, and draped into a sterile field. A timeout was taken to verify the appropriate patient, position, procedure, availability of equipment and personnel, and administration of perioperative antibiotics.   The previous scar line over the IT pump was incised with a 10 blade, and careful sharp dissection carried down to the pump with the Metzenbaum scissors.  The pump was inspected, and good connection between the catheter and the pump side port connector noted.The rest of the catheter appeared to be intact. Aspiration of the side port of the pump returned 2cc of the clear fluid; given that the calculated volume of the catheter is 0.229 mL, the catheter was flushed of slightly greater than 4 volumes. Although we now had an indication that the catheter was intact and in the intrathecal space,  a draped c-arm was brought into the  field and a catheter dye study was performed to make sure that the catheter itself was verifiably intact. Omnipaque 300 was gently injected into the proximal catheter end, and seen to fill the entire length of the catheter, and into the intrathecal space creating a myelogram.   At this point, given that the entire mechanism appeared to be intact, we moved to removal of the device. We had discussed this option with the patient in the office, and reverified that decision had been made by the patient again prior to any medication given, and going back to the OR.     Attention was then turned to exposure and removal of the catheter. c-arm identified the overall path of the catheter during the dye study, and we thought we could identify the small metallic component of the catheter connector around the left side of the L4-5 space. The previous lumbar incision was identified, infiltrated with 0.25% bupivicaine ,and incised. Sharp and blunt dissection, ultimately revealed the connector, and we were able to dissect along the catheter to the anchor, and the dorsolumbar fascia. Two 0 silk sutures were placed in a purse string fashion into the fascia. The entire length of the catheter was easily withdrawn intact, and the pursestring sutures tied down.  The catheter was cut in pieces, but altogether we were able to remove all of the catheter by withdrawing part of it from the spinal incision and the rest through the abdominal/pump pocket incision.  The pump was carefully cleaned, and using a fresh, clean Huber needle the reservoir was accessed and drained of the expected amount  of clear, colorless fluid into sterile syringes. It was  discarded with the OR staff witnessing its disposal. The pump itself was handed off the field, and to the [employer] representative for QC evaluation.   The lumbar incision was copiously irrigated with bacitracin-containing irrigation, and the incision closed with 2 deeper layers of   interrupted vicryl sutures, a more superficial layer of 2-0 vicryl sutures, and the skin closed with staples. The pocket was then copiously irrigated with bacitracin-containing irrigation, the pocket fenestrated with a 15 blade, deeper portions approximated with 0 silk suture, and the incision closed with 2 layers of 2-0 interrupted vicryl sutures, and the skin closed with staples. Bacitracin ointment and a sterile, occlusive dressings were placed.   Needle, instrument and sponge counts were correct x2 at the end of the case.   The patient was turned onto a stretcher, extubated by the anesthesiology team, and taken to PACU in stable condition.   DRAINS: none SPECIMENS: fluid sent for analysis/culture  COMPLICATIONS: none CONDITION: stable throughout, and to the PACU  DISPOSITION: discharge home after recovery. No abx. Pt. Has pain medication. He will follow up with me in 2 weeks for a wound check and removal of staples if appropriate. Case discussed with the patient's family. Discussion with the family included the possibility of CSF leak given that the catheter has stented the dura open for ~9 years, fluid accumulation in the pocket site, and care of some groin crease skin irritation noted at the time of prep and drape.

## 2015-04-06 NOTE — Progress Notes (Signed)
Orthopedic Tech Progress Note Patient Details:  KIRTUS SAZAMA 1955/08/17 [id number]  Ortho Devices Type of Ortho Device: Abdominal binder Ortho Device/Splint Location: abdomen Ortho Device/Splint Interventions: Ordered Viewed order from doctor's order list  Hildred Priest 04/06/2015, 3:03 PM

## 2015-04-06 NOTE — Transfer of Care (Signed)
Immediate Anesthesia Transfer of Care Note  Patient: [name]  Procedure(s) Performed: Procedure(s) with comments: IT Pump Exploration, possible revision, possible removal (N/A) - IT Pump Exploration, possible revision, possible removal  Patient Location: PACU  Anesthesia Type:General  Level of Consciousness: awake, alert  and oriented  Airway & Oxygen Therapy: Patient Spontanous Breathing and Patient connected to nasal cannula oxygen  Post-op Assessment: Report given to RN, Post -op Vital signs reviewed and stable and Patient moving all extremities X 4  Post vital signs: Reviewed and stable  Last Vitals:  Filed Vitals:   04/06/15 1356  BP: 159/86  Pulse: 88  Temp: 36.5 C  Resp: 18    Complications: No apparent anesthesia complications

## 2015-04-06 NOTE — Discharge Instructions (Signed)
Dr. Stanislawa Gaffin Post-Op Orders ° °• Ice Pack - 20 minutes on (in a pillow case), and 20 minutes off. Wear the ice pack UNDER the binder. °• Follow up in office, they will call you for an appointment in 10 days to 2 weeks. °• Increase activity gradually.   °• No lifting anything heavier than a gallon of milk (10 pounds) until seen in the office. °• Advance diet slowly as tolerated. °• Dressing care:  Keep dressing dry for 3 days, and on Post-op day 4, may shower. °• Call for fever, drainage, and redness. °• No swimming or bathing in a bathtub (do not get into standing water). °•  °

## 2015-04-06 NOTE — Anesthesia Postprocedure Evaluation (Signed)
Anesthesia Post Note  Patient: [name]  Procedure(s) Performed: Procedure(s) (LRB): IT Pump Exploration, possible revision, possible removal (N/A)  Anesthesia type: General  Patient location: PACU  Post pain: Pain level controlled  Post assessment: Post-op Vital signs reviewed  Last Vitals: BP 153/96 mmHg  Pulse 93  Temp(Src) 36.5 C (Oral)  Resp 19  Ht 5\' 11"  (1.803 m)  Wt 269 lb 12.8 oz (122.38 kg)  BMI 37.65 kg/m2  SpO2 99%  Post vital signs: Reviewed  Level of consciousness: sedated  Complications: No apparent anesthesia complications

## 2015-04-06 NOTE — Anesthesia Procedure Notes (Signed)
Procedure Name: Intubation Date/Time: 04/06/2015 11:04 AM Performed by: Gaylene Brooks Pre-anesthesia Checklist: Patient identified, Timeout performed, Emergency Drugs available, Suction available and Patient being monitored Patient Re-evaluated:Patient Re-evaluated prior to inductionOxygen Delivery Method: Circle system utilized Preoxygenation: Pre-oxygenation with 100% oxygen Intubation Type: IV induction Ventilation: Mask ventilation without difficulty and Oral airway inserted - appropriate to patient size Laryngoscope Size: Sabra Heck and 2 Grade View: Grade II Tube type: Oral Tube size: 7.5 mm Number of attempts: 1 Airway Equipment and Method: Stylet Placement Confirmation: ETT inserted through vocal cords under direct vision,  breath sounds checked- equal and bilateral,  positive ETCO2 and CO2 detector Secured at: 23 cm Tube secured with: Tape Dental Injury: Teeth and Oropharynx as per pre-operative assessment

## 2015-04-06 NOTE — Anesthesia Preprocedure Evaluation (Signed)
Anesthesia Evaluation  Patient identified by MRN, date of birth, ID band Patient awake    Reviewed: Allergy & Precautions, H&P , NPO status , Patient's Chart, lab work & pertinent test results  History of Anesthesia Complications Negative for: history of anesthetic complications  Airway Mallampati: II  TM Distance: >3 FB Neck ROM: Full    Dental  (+) Teeth Intact, Dental Advisory Given   Pulmonary sleep apnea , Current Smoker, former smoker,  bipap   breath sounds clear to auscultation       Cardiovascular hypertension, Pt. on medications + Peripheral Vascular Disease   Rhythm:Regular Rate:Normal     Neuro/Psych  Headaches, PSYCHIATRIC DISORDERS Depression  Neuromuscular disease    GI/Hepatic Neg liver ROS, GERD  Medicated and Controlled,  Endo/Other  diabetes, Type 2Diet controlled  Renal/GU negative Renal ROS     Musculoskeletal  (+) Arthritis ,   Abdominal   Peds  Hematology  (+) anemia ,   Anesthesia Other Findings   Reproductive/Obstetrics negative OB ROS                             Anesthesia Physical  Anesthesia Plan  ASA: II  Anesthesia Plan: General   Post-op Pain Management:    Induction: Intravenous  Airway Management Planned: Oral ETT  Additional Equipment:   Intra-op Plan:   Post-operative Plan: Extubation in OR  Informed Consent: I have reviewed the patients History and Physical, chart, labs and discussed the procedure including the risks, benefits and alternatives for the proposed anesthesia with the patient or authorized representative who has indicated his/her understanding and acceptance.   Dental advisory given  Plan Discussed with: CRNA, Anesthesiologist and Surgeon  Anesthesia Plan Comments:         Anesthesia Quick Evaluation

## 2015-04-09 ENCOUNTER — Encounter (HOSPITAL_COMMUNITY): Payer: Self-pay | Admitting: Anesthesiology

## 2016-04-08 ENCOUNTER — Other Ambulatory Visit: Payer: Self-pay | Admitting: Anesthesiology

## 2016-04-23 ENCOUNTER — Other Ambulatory Visit: Payer: Self-pay

## 2016-04-23 ENCOUNTER — Encounter (HOSPITAL_COMMUNITY)
Admission: RE | Admit: 2016-04-23 | Discharge: 2016-04-23 | Disposition: A | Discharge status: Home / Self Care | Payer: Medicare Other | Source: Ambulatory Visit | Attending: Anesthesiology | Admitting: Anesthesiology

## 2016-04-23 ENCOUNTER — Encounter (HOSPITAL_COMMUNITY): Payer: Self-pay

## 2016-04-23 DIAGNOSIS — Z8249 Family history of ischemic heart disease and other diseases of the circulatory system: Secondary | ICD-10-CM | POA: Diagnosis not present

## 2016-04-23 DIAGNOSIS — Z888 Allergy status to other drugs, medicaments and biological substances status: Secondary | ICD-10-CM | POA: Diagnosis not present

## 2016-04-23 DIAGNOSIS — M961 Postlaminectomy syndrome, not elsewhere classified: Secondary | ICD-10-CM | POA: Diagnosis present

## 2016-04-23 DIAGNOSIS — M170 Bilateral primary osteoarthritis of knee: Secondary | ICD-10-CM | POA: Diagnosis not present

## 2016-04-23 DIAGNOSIS — K219 Gastro-esophageal reflux disease without esophagitis: Secondary | ICD-10-CM | POA: Diagnosis not present

## 2016-04-23 DIAGNOSIS — F17210 Nicotine dependence, cigarettes, uncomplicated: Secondary | ICD-10-CM | POA: Diagnosis not present

## 2016-04-23 DIAGNOSIS — Y753 Surgical instruments, materials and neurological devices (including sutures) associated with adverse incidents: Secondary | ICD-10-CM | POA: Diagnosis not present

## 2016-04-23 DIAGNOSIS — I10 Essential (primary) hypertension: Secondary | ICD-10-CM | POA: Diagnosis not present

## 2016-04-23 DIAGNOSIS — M5136 Other intervertebral disc degeneration, lumbar region: Secondary | ICD-10-CM | POA: Diagnosis not present

## 2016-04-23 DIAGNOSIS — Z96653 Presence of artificial knee joint, bilateral: Secondary | ICD-10-CM | POA: Diagnosis not present

## 2016-04-23 DIAGNOSIS — F329 Major depressive disorder, single episode, unspecified: Secondary | ICD-10-CM | POA: Diagnosis not present

## 2016-04-23 DIAGNOSIS — Z7982 Long term (current) use of aspirin: Secondary | ICD-10-CM | POA: Diagnosis not present

## 2016-04-23 DIAGNOSIS — G4730 Sleep apnea, unspecified: Secondary | ICD-10-CM | POA: Diagnosis not present

## 2016-04-23 DIAGNOSIS — Z833 Family history of diabetes mellitus: Secondary | ICD-10-CM | POA: Diagnosis not present

## 2016-04-23 DIAGNOSIS — T85193A Other mechanical complication of implanted electronic neurostimulator, generator, initial encounter: Secondary | ICD-10-CM | POA: Diagnosis not present

## 2016-04-23 DIAGNOSIS — M479 Spondylosis, unspecified: Secondary | ICD-10-CM | POA: Diagnosis not present

## 2016-04-23 DIAGNOSIS — E119 Type 2 diabetes mellitus without complications: Secondary | ICD-10-CM | POA: Diagnosis not present

## 2016-04-23 DIAGNOSIS — G894 Chronic pain syndrome: Secondary | ICD-10-CM | POA: Diagnosis not present

## 2016-04-23 LAB — CBC
HCT: 41.6 % (ref 39.0–52.0)
HEMOGLOBIN: 14.7 g/dL (ref 13.0–17.0)
MCH: 30.4 pg (ref 26.0–34.0)
MCHC: 35.3 g/dL (ref 30.0–36.0)
MCV: 86.1 fL (ref 78.0–100.0)
Platelets: 183 10*3/uL (ref 150–400)
RBC: 4.83 MIL/uL (ref 4.22–5.81)
RDW: 14.4 % (ref 11.5–15.5)
WBC: 6.5 10*3/uL (ref 4.0–10.5)

## 2016-04-23 LAB — BASIC METABOLIC PANEL
Anion gap: 10 (ref 5–15)
BUN: 18 mg/dL (ref 6–20)
CHLORIDE: 105 mmol/L (ref 101–111)
CO2: 23 mmol/L (ref 22–32)
CREATININE: 0.87 mg/dL (ref 0.61–1.24)
Calcium: 9.5 mg/dL (ref 8.9–10.3)
Glucose, Bld: 138 mg/dL — ABNORMAL HIGH (ref 65–99)
POTASSIUM: 3.3 mmol/L — AB (ref 3.5–5.1)
Sodium: 138 mmol/L (ref 135–145)

## 2016-04-23 LAB — SURGICAL PCR SCREEN
MRSA, PCR: NEGATIVE
Staphylococcus aureus: NEGATIVE

## 2016-04-23 LAB — PROTIME-INR
INR: 1
Prothrombin Time: 13.1 seconds (ref 11.4–15.2)

## 2016-04-23 LAB — GLUCOSE, CAPILLARY: Glucose-Capillary: 131 mg/dL — ABNORMAL HIGH (ref 65–99)

## 2016-04-23 LAB — APTT: aPTT: 30 seconds (ref 24–36)

## 2016-04-23 NOTE — Progress Notes (Signed)
PCP is Dr,. Alma Friendly Denies seeing a cardiologist. Denies ever having card cath. states he had a stress test many years ago. Echo noted from 01-12-16 Instructed to bring in his cpap mask on the day of surgery.

## 2016-04-23 NOTE — Pre-Procedure Instructions (Signed)
[name]  04/23/2016      [facility] [place], [place], [id number] [name] [address] Phone: [phone] Fax: [phone]    Your procedure is scheduled on Dec 8  Report to [place] at 530 A.M.  Call this number if you have problems the morning of surgery:  [phone]   Remember:  Do not eat food or drink liquids after midnight.  Take these medicines the morning of surgery with A SIP OF WATER  Cetirizine (Zyrtec) if needed, Fluticasone (Flonase) if needed, Omeprazole (prilosec), Oxycodone (Roxicodone) if needed  Stop taking Asprin, BC's, Goody's, Ibuprofen, Advil, Motrin, Aleve, Herbal medications, Fish Oil   Do not wear jewelry, make-up or nail polish.  Do not wear lotions, powders, or perfumes, or deoderant.  Do not shave 48 hours prior to surgery.  Men may shave face and neck.  Do not bring valuables to the hospital.  [facility] is not responsible for any belongings or valuables.  Contacts, dentures or bridgework may not be worn into surgery.  Leave your suitcase in the car.  After surgery it may be brought to your room.  For patients admitted to the hospital, discharge time will be determined by your treatment team.  Patients discharged the day of surgery will not be allowed to drive home.   Special instructions:  [place] - Preparing for Surgery  Before surgery, you can play an important role.  Because skin is not sterile, your skin needs to be as free of germs as possible.  You can reduce the number of germs on you skin by washing with CHG (chlorahexidine gluconate) soap before surgery.  CHG is an antiseptic cleaner which kills germs and bonds with the skin to continue killing germs even after washing.  Please DO NOT use if you have an allergy to CHG or antibacterial soaps.  If your skin becomes reddened/irritated stop using the CHG and inform your nurse when you  arrive at Short Stay.  Do not shave (including legs and underarms) for at least 48 hours prior to the first CHG shower.  You may shave your face.  Please follow these instructions carefully:   1.  Shower with CHG Soap the night before surgery and the    morning of Surgery.  2.  If you choose to wash your hair, wash your hair first as usual with your  normal shampoo.  3.  After you shampoo, rinse your hair and body thoroughly to remove the Shampoo.  4.  Use CHG as you would any other liquid soap.  You can apply chg directly  to the skin and wash gently with scrungie or a clean washcloth.  5.  Apply the CHG Soap to your body ONLY FROM THE NECK DOWN.   Do not use on open wounds or open sores.  Avoid contact with your eyes,  ears, mouth and genitals (private parts).  Wash genitals (private parts)    with your normal soap.  6.  Wash thoroughly, paying special attention to the area where your surgery   will be performed.  7.  Thoroughly rinse your body with warm water from the neck down.  8.  DO NOT shower/wash with your normal soap after using and rinsing off the CHG Soap.  9.  Pat yourself dry with a clean towel.            10.  Wear clean pajamas.  11.  Place clean sheets on your bed the night of your first shower and do not sleep with pets.  Day of Surgery  Do not apply any lotions/deoderants the morning of surgery.  Please wear clean clothes to the hospital/surgery center.     Please read over the following fact sheets that you were given. Pain Booklet, Coughing and Deep Breathing, MRSA Information and Surgical Site Infection Prevention

## 2016-04-24 LAB — HEMOGLOBIN A1C
Hgb A1c MFr Bld: 6 % — ABNORMAL HIGH (ref 4.8–5.6)
Mean Plasma Glucose: 126 mg/dL

## 2016-04-24 NOTE — H&P (Signed)
[name] is an 60 y.o. male.   Chief Complaint: SCS IPG failure HPI: 60 year old gentleman with multiple traumatic injuries, status post surgical intervention, as well as lumbar degenerative disc disease.  Had an SCS placed for pain management by another provider, but the battery for that SCS is at the end of his lifetime.He is requesting replacement  Past Medical History:  Diagnosis Date  . Anemia    per patient  . Arthritis    knees and back  . Chronic back pain   . Depression   . Diabetes mellitus    control with diet/does not check cbgs at home  . Dizziness of unknown cause    when accessing the pump to refill for about week after  . GERD (gastroesophageal reflux disease)   . Headache    occassional migraines  . History of Bell's palsy   . History of bronchitis   . Hypertension   . Numbness and tingling of both legs   . Sleep apnea    wears CPAP    Past Surgical History:  Procedure Laterality Date  . APPENDECTOMY    . CHOLECYSTECTOMY    . COLONOSCOPY    . HAMMER TOE SURGERY Bilateral   . heel spurs     bilateral  . JOINT REPLACEMENT     bilateral knees  . KNEE ARTHROSCOPY Bilateral    "multiple knee surgeries"   . MUSCLE FLAP CLOSURE     skin grafting for muscle shifting  . PAIN PUMP IMPLANTATION    . PAIN PUMP REVISION N/A 12/28/2012   Procedure: intrathecal Baclofen pump exploration with revision of catheter;  Surgeon: [name], MD;  Location: [facility];  Service: Neurosurgery;  Laterality: N/A;  Baclofen pump exploration with [name] to assist  . PAIN PUMP REVISION N/A 04/06/2015   Procedure: IT Pump Exploration, possible revision, possible removal;  Surgeon: Clydell Hakim, MD;  Location: [facility];  Service: Neurosurgery;  Laterality: N/A;  IT Pump Exploration, possible revision, possible removal  . SPINAL CORD STIMULATOR INSERTION  12/23/2011   Procedure: LUMBAR SPINAL CORD STIMULATOR INSERTION;  Surgeon: [name], MD;  Location: [facility];  Service: Neurosurgery;  Laterality: N/A;  Implantable pulse generator change and pump revision  . TONSILLECTOMY    . UVULOPALATOPHARYNGOPLASTY    . WRIST ARTHROPLASTY     right = repair cartiledge    Family History  Problem Relation Age of Onset  . Atrial fibrillation Mother   . Hypertension Mother   . Diabetes Father   . Hyperlipidemia Father   . Diabetes Brother   . Hypertension Brother    Social History:  reports that he has been smoking Cigarettes.  He has been smoking about 0.50 packs per day. He has never used smokeless tobacco. He reports that he does not drink alcohol or use drugs.  Allergies:  Allergies  Allergen Reactions  . Norvasc [Amlodipine Besylate] Swelling    SWELLING REACTION UNSPECIFIED     Medications Prior to Admission  Medication Sig Dispense Refill  . amoxicillin (AMOXIL) 500 MG capsule Take 500 mg by mouth every 6 (six) hours.    [name] aspirin EC 81 MG tablet Take 81 mg by mouth daily.    [name] aspirin-acetaminophen-caffeine (EXCEDRIN MIGRAINE) 250-250-65 MG tablet Take 2 tablets by mouth 2 (two) times daily as needed for headache.    [name] atorvastatin (LIPITOR) 10 MG tablet Take 10 mg by mouth at bedtime.     . B Complex-C (  SUPER B COMPLEX PO) Take 1 capsule by mouth daily.    . cetirizine (ZYRTEC) 10 MG tablet Take 10 mg by mouth daily as needed for allergies.    . fluticasone (FLONASE) 50 MCG/ACT nasal spray Place 2 sprays into both nostrils daily as needed for allergies.     [name] gabapentin (NEURONTIN) 800 MG tablet Take 400 mg by mouth at bedtime as needed (for sleep).     . Ginger, Zingiber officinalis, (GINGER ROOT) 550 MG CAPS Take 550 mg by mouth at bedtime.    . hydrochlorothiazide (HYDRODIURIL) 25 MG tablet Take 25 mg by mouth once daily    . lisinopril (PRINIVIL,ZESTRIL) 40 MG tablet Take 40 mg by mouth once daily    . omeprazole (PRILOSEC) 40 MG capsule Take 40 mg by mouth 2 (two) times daily.    [name] oxyCODONE (ROXICODONE) 15 MG immediate  release tablet Take 1 tablet (15 mg total) by mouth every 4 (four) hours as needed for pain. (Patient taking differently: Take 15 mg by mouth every 4 (four) hours as needed for pain. Do not exceed 5 tablet/24 hrs.) 60 tablet 0  . Potassium 99 MG TABS Take 99 mg by mouth daily.      Results for orders placed or performed during the hospital encounter of 04/23/16 (from the past 48 hour(s))  Glucose, capillary     Status: Abnormal   Collection Time: 04/23/16 10:11 AM  Result Value Ref Range   Glucose-Capillary 131 (H) 65 - 99 mg/dL  Hemoglobin A1c     Status: Abnormal   Collection Time: 04/23/16 11:13 AM  Result Value Ref Range   Hgb A1c MFr Bld 6.0 (H) 4.8 - 5.6 %    Comment: (NOTE)         Pre-diabetes: 5.7 - 6.4         Diabetes: >6.4         Glycemic control for adults with diabetes: <7.0    Mean Plasma Glucose 126 mg/dL    Comment: (NOTE) Performed At: [facility] [place], [place] [id number] Lindon Romp MD OE:[id number]   Surgical pcr screen     Status: None   Collection Time: 04/23/16 11:14 AM  Result Value Ref Range   MRSA, PCR NEGATIVE NEGATIVE   Staphylococcus aureus NEGATIVE NEGATIVE    Comment:        The Xpert SA Assay (FDA approved for NASAL specimens in patients over 67 years of age), is one component of a comprehensive surveillance program.  Test performance has been validated by [facility] for patients greater than or equal to 87 year old. It is not intended to diagnose infection nor to guide or monitor treatment.   Basic metabolic panel     Status: Abnormal   Collection Time: 04/23/16 11:14 AM  Result Value Ref Range   Sodium 138 135 - 145 mmol/L   Potassium 3.3 (L) 3.5 - 5.1 mmol/L   Chloride 105 101 - 111 mmol/L   CO2 23 22 - 32 mmol/L   Glucose, Bld 138 (H) 65 - 99 mg/dL   BUN 18 6 - 20 mg/dL   Creatinine, Ser 0.87 0.61 - 1.24 mg/dL   Calcium 9.5 8.9 - 10.3 mg/dL   GFR calc non Af Amer >60 >60 mL/min   GFR calc Af  Amer >60 >60 mL/min    Comment: (NOTE) The eGFR has been calculated using the CKD EPI equation. This calculation has not been validated in all clinical situations.  eGFR's persistently <60 mL/min signify possible Chronic Kidney Disease.    Anion gap 10 5 - 15  CBC     Status: None   Collection Time: 04/23/16 11:14 AM  Result Value Ref Range   WBC 6.5 4.0 - 10.5 K/uL    Comment: WHITE COUNT CONFIRMED ON SMEAR   RBC 4.83 4.22 - 5.81 MIL/uL   Hemoglobin 14.7 13.0 - 17.0 g/dL   HCT 41.6 39.0 - 52.0 %   MCV 86.1 78.0 - 100.0 fL   MCH 30.4 26.0 - 34.0 pg   MCHC 35.3 30.0 - 36.0 g/dL   RDW 14.4 11.5 - 15.5 %   Platelets 183 150 - 400 K/uL  APTT     Status: None   Collection Time: 04/23/16 11:14 AM  Result Value Ref Range   aPTT 30 24 - 36 seconds  Protime-INR     Status: None   Collection Time: 04/23/16 11:14 AM  Result Value Ref Range   Prothrombin Time 13.1 11.4 - 15.2 seconds   INR 1.00    No results found.  Review of Systems  Constitutional: Negative.   HENT: Negative.   Eyes: Negative.   Respiratory: Negative.   Cardiovascular: Negative.   Gastrointestinal: Negative.   Genitourinary: Negative.   Musculoskeletal: Positive for back pain and joint pain. Negative for falls and myalgias.  Skin: Negative.   Neurological: Negative.   Endo/Heme/Allergies: Negative.   Psychiatric/Behavioral: Negative.     Blood pressure 132/70, pulse 70, temperature 97.9 F (36.6 C), resp. rate 18, SpO2 95 %. Physical Exam  Constitutional: He is oriented to person, place, and time. He appears well-developed and well-nourished.  HENT:  Head: Normocephalic and atraumatic.  Eyes: EOM are normal. Pupils are equal, round, and reactive to light.  Neck: Normal range of motion.  Cardiovascular: Normal rate and regular rhythm.   Respiratory: Effort normal.  Musculoskeletal: Normal range of motion.  Neurological: He is alert and oriented to person, place, and time.  Skin: Skin is warm and dry.   Psychiatric: He has a normal mood and affect. His behavior is normal. Judgment and thought content normal.     Assessment/Plan 1) chronic pain syndrome 2) SCS battery failure/end of life  PLAN: replace IPG  [name], MD 04/25/2016, 7:31 AM

## 2016-04-24 NOTE — Anesthesia Preprocedure Evaluation (Addendum)
Anesthesia Evaluation  Patient identified by MRN, date of birth, ID band Patient awake    Reviewed: Allergy & Precautions, NPO status , Patient's Chart, lab work & pertinent test results  Airway Mallampati: I  TM Distance: >3 FB Neck ROM: Full    Dental   Pulmonary sleep apnea , Current Smoker,    Pulmonary exam normal        Cardiovascular hypertension, Pt. on medications Normal cardiovascular exam     Neuro/Psych    GI/Hepatic GERD  Controlled,  Endo/Other  diabetes  Renal/GU      Musculoskeletal   Abdominal   Peds  Hematology   Anesthesia Other Findings   Reproductive/Obstetrics                            Anesthesia Physical Anesthesia Plan  ASA: III  Anesthesia Plan: General   Post-op Pain Management:    Induction: Intravenous  Airway Management Planned: Oral ETT  Additional Equipment:   Intra-op Plan:   Post-operative Plan: Extubation in OR  Informed Consent: I have reviewed the patients History and Physical, chart, labs and discussed the procedure including the risks, benefits and alternatives for the proposed anesthesia with the patient or authorized representative who has indicated his/her understanding and acceptance.     Plan Discussed with: CRNA and Surgeon  Anesthesia Plan Comments:         Anesthesia Quick Evaluation

## 2016-04-25 ENCOUNTER — Encounter (HOSPITAL_COMMUNITY)
Admission: RE | Disposition: A | Discharge status: Home / Self Care | Payer: Self-pay | Source: Ambulatory Visit | Attending: Anesthesiology

## 2016-04-25 ENCOUNTER — Ambulatory Visit (HOSPITAL_COMMUNITY): Payer: Medicare Other | Admitting: Anesthesiology

## 2016-04-25 ENCOUNTER — Ambulatory Visit (HOSPITAL_COMMUNITY)
Admission: RE | Admit: 2016-04-25 | Discharge: 2016-04-25 | Disposition: A | Discharge status: Home / Self Care | Payer: Medicare Other | Source: Ambulatory Visit | Attending: Anesthesiology | Admitting: Anesthesiology

## 2016-04-25 DIAGNOSIS — F17210 Nicotine dependence, cigarettes, uncomplicated: Secondary | ICD-10-CM | POA: Insufficient documentation

## 2016-04-25 DIAGNOSIS — M961 Postlaminectomy syndrome, not elsewhere classified: Secondary | ICD-10-CM | POA: Insufficient documentation

## 2016-04-25 DIAGNOSIS — T85193A Other mechanical complication of implanted electronic neurostimulator, generator, initial encounter: Secondary | ICD-10-CM | POA: Diagnosis not present

## 2016-04-25 DIAGNOSIS — G894 Chronic pain syndrome: Secondary | ICD-10-CM | POA: Insufficient documentation

## 2016-04-25 DIAGNOSIS — Y753 Surgical instruments, materials and neurological devices (including sutures) associated with adverse incidents: Secondary | ICD-10-CM | POA: Insufficient documentation

## 2016-04-25 DIAGNOSIS — Z833 Family history of diabetes mellitus: Secondary | ICD-10-CM | POA: Insufficient documentation

## 2016-04-25 DIAGNOSIS — M170 Bilateral primary osteoarthritis of knee: Secondary | ICD-10-CM | POA: Insufficient documentation

## 2016-04-25 DIAGNOSIS — Z8249 Family history of ischemic heart disease and other diseases of the circulatory system: Secondary | ICD-10-CM | POA: Insufficient documentation

## 2016-04-25 DIAGNOSIS — M5136 Other intervertebral disc degeneration, lumbar region: Secondary | ICD-10-CM | POA: Insufficient documentation

## 2016-04-25 DIAGNOSIS — G4730 Sleep apnea, unspecified: Secondary | ICD-10-CM | POA: Insufficient documentation

## 2016-04-25 DIAGNOSIS — F329 Major depressive disorder, single episode, unspecified: Secondary | ICD-10-CM | POA: Insufficient documentation

## 2016-04-25 DIAGNOSIS — M479 Spondylosis, unspecified: Secondary | ICD-10-CM | POA: Insufficient documentation

## 2016-04-25 DIAGNOSIS — I10 Essential (primary) hypertension: Secondary | ICD-10-CM | POA: Insufficient documentation

## 2016-04-25 DIAGNOSIS — K219 Gastro-esophageal reflux disease without esophagitis: Secondary | ICD-10-CM | POA: Insufficient documentation

## 2016-04-25 DIAGNOSIS — E119 Type 2 diabetes mellitus without complications: Secondary | ICD-10-CM | POA: Insufficient documentation

## 2016-04-25 DIAGNOSIS — Z96653 Presence of artificial knee joint, bilateral: Secondary | ICD-10-CM | POA: Insufficient documentation

## 2016-04-25 DIAGNOSIS — Z888 Allergy status to other drugs, medicaments and biological substances status: Secondary | ICD-10-CM | POA: Insufficient documentation

## 2016-04-25 DIAGNOSIS — Z7982 Long term (current) use of aspirin: Secondary | ICD-10-CM | POA: Insufficient documentation

## 2016-04-25 HISTORY — PX: SPINAL CORD STIMULATOR BATTERY EXCHANGE: SHX6202

## 2016-04-25 LAB — GLUCOSE, CAPILLARY: Glucose-Capillary: 99 mg/dL (ref 65–99)

## 2016-04-25 SURGERY — SPINAL CORD STIMULATOR BATTERY EXCHANGE
Anesthesia: General

## 2016-04-25 MED ORDER — FENTANYL CITRATE (PF) 100 MCG/2ML IJ SOLN
INTRAMUSCULAR | Status: DC | PRN
  Administered 2016-04-25: 50 ug via INTRAVENOUS
  Administered 2016-04-25: 150 ug via INTRAVENOUS

## 2016-04-25 MED ORDER — CHLORHEXIDINE GLUCONATE CLOTH 2 % EX PADS: 6.0000 | MEDICATED_PAD | Freq: Once | CUTANEOUS | Status: DC

## 2016-04-25 MED ORDER — EPHEDRINE SULFATE 50 MG/ML IJ SOLN
INTRAMUSCULAR | Status: DC | PRN
  Administered 2016-04-25: 10 mg via INTRAVENOUS

## 2016-04-25 MED ORDER — MIDAZOLAM HCL 5 MG/5ML IJ SOLN
INTRAMUSCULAR | Status: DC | PRN
  Administered 2016-04-25: 2 mg via INTRAVENOUS

## 2016-04-25 MED ORDER — LIDOCAINE-EPINEPHRINE 1 %-1:100000 IJ SOLN
INTRAMUSCULAR | Status: DC | PRN
  Administered 2016-04-25: 7 mL

## 2016-04-25 MED ORDER — FENTANYL CITRATE (PF) 100 MCG/2ML IJ SOLN
INTRAMUSCULAR | Status: AC
  Filled 2016-04-25: qty 4

## 2016-04-25 MED ORDER — CEFAZOLIN SODIUM-DEXTROSE 2-4 GM/100ML-% IV SOLN
2.0000 g | INTRAVENOUS | Status: AC
  Administered 2016-04-25: 2 g via INTRAVENOUS
  Filled 2016-04-25: qty 100

## 2016-04-25 MED ORDER — PROPOFOL 10 MG/ML IV BOLUS
INTRAVENOUS | Status: DC | PRN
  Administered 2016-04-25: 120 mg via INTRAVENOUS

## 2016-04-25 MED ORDER — LIDOCAINE HCL (CARDIAC) 20 MG/ML IV SOLN
INTRAVENOUS | Status: DC | PRN
  Administered 2016-04-25: 100 mg via INTRAVENOUS

## 2016-04-25 MED ORDER — KETAMINE HCL-SODIUM CHLORIDE 100-0.9 MG/10ML-% IV SOSY
PREFILLED_SYRINGE | INTRAVENOUS | Status: AC
  Filled 2016-04-25: qty 10

## 2016-04-25 MED ORDER — PROPOFOL 10 MG/ML IV BOLUS
INTRAVENOUS | Status: AC
  Filled 2016-04-25: qty 20

## 2016-04-25 MED ORDER — ROCURONIUM BROMIDE 10 MG/ML (PF) SYRINGE
PREFILLED_SYRINGE | INTRAVENOUS | Status: AC
  Filled 2016-04-25: qty 10

## 2016-04-25 MED ORDER — LIDOCAINE-EPINEPHRINE (PF) 2 %-1:200000 IJ SOLN
INTRAMUSCULAR | Status: AC
  Filled 2016-04-25: qty 20

## 2016-04-25 MED ORDER — ACETAMINOPHEN 325 MG PO TABS
ORAL_TABLET | ORAL | Status: AC
  Filled 2016-04-25: qty 2

## 2016-04-25 MED ORDER — SUGAMMADEX SODIUM 500 MG/5ML IV SOLN
INTRAVENOUS | Status: AC
  Filled 2016-04-25: qty 5

## 2016-04-25 MED ORDER — 0.9 % SODIUM CHLORIDE (POUR BTL) OPTIME
TOPICAL | Status: DC | PRN
  Administered 2016-04-25: 1000 mL

## 2016-04-25 MED ORDER — ONDANSETRON HCL 4 MG/2ML IJ SOLN
INTRAMUSCULAR | Status: DC | PRN
  Administered 2016-04-25: 4 mg via INTRAVENOUS

## 2016-04-25 MED ORDER — ACETAMINOPHEN 325 MG PO TABS
650.0000 mg | ORAL_TABLET | Freq: Once | ORAL | Status: AC
  Administered 2016-04-25: 650 mg via ORAL

## 2016-04-25 MED ORDER — LACTATED RINGERS IV SOLN
INTRAVENOUS | Status: DC | PRN
  Administered 2016-04-25 (×2): via INTRAVENOUS

## 2016-04-25 MED ORDER — MEPERIDINE HCL 25 MG/ML IJ SOLN: 6.2500 mg | INTRAMUSCULAR | Status: DC | PRN

## 2016-04-25 MED ORDER — MIDAZOLAM HCL 2 MG/2ML IJ SOLN
INTRAMUSCULAR | Status: AC
  Filled 2016-04-25: qty 2

## 2016-04-25 MED ORDER — PHENYLEPHRINE HCL 10 MG/ML IJ SOLN
INTRAMUSCULAR | Status: DC | PRN
  Administered 2016-04-25: 80 ug via INTRAVENOUS
  Administered 2016-04-25: 120 ug via INTRAVENOUS
  Administered 2016-04-25: 40 ug via INTRAVENOUS
  Administered 2016-04-25: 80 ug via INTRAVENOUS

## 2016-04-25 MED ORDER — PHENYLEPHRINE 40 MCG/ML (10ML) SYRINGE FOR IV PUSH (FOR BLOOD PRESSURE SUPPORT)
PREFILLED_SYRINGE | INTRAVENOUS | Status: AC
  Filled 2016-04-25: qty 10

## 2016-04-25 MED ORDER — ALBUTEROL SULFATE HFA 108 (90 BASE) MCG/ACT IN AERS
INHALATION_SPRAY | RESPIRATORY_TRACT | Status: DC | PRN
  Administered 2016-04-25: 2 via RESPIRATORY_TRACT

## 2016-04-25 MED ORDER — ONDANSETRON HCL 4 MG/2ML IJ SOLN: 4.0000 mg | Freq: Once | INTRAMUSCULAR | Status: DC | PRN

## 2016-04-25 MED ORDER — SUGAMMADEX SODIUM 500 MG/5ML IV SOLN
INTRAVENOUS | Status: DC | PRN
  Administered 2016-04-25: 450 mg via INTRAVENOUS

## 2016-04-25 MED ORDER — LIDOCAINE 2% (20 MG/ML) 5 ML SYRINGE
INTRAMUSCULAR | Status: AC
  Filled 2016-04-25: qty 5

## 2016-04-25 MED ORDER — ALBUTEROL SULFATE HFA 108 (90 BASE) MCG/ACT IN AERS
INHALATION_SPRAY | RESPIRATORY_TRACT | Status: AC
  Filled 2016-04-25: qty 6.7

## 2016-04-25 MED ORDER — HYDROMORPHONE HCL 1 MG/ML IJ SOLN: 0.2500 mg | INTRAMUSCULAR | Status: DC | PRN

## 2016-04-25 MED ORDER — SODIUM CHLORIDE 0.9 % IR SOLN
Status: DC | PRN
  Administered 2016-04-25: 08:00:00

## 2016-04-25 MED ORDER — ROCURONIUM BROMIDE 100 MG/10ML IV SOLN
INTRAVENOUS | Status: DC | PRN
  Administered 2016-04-25: 50 mg via INTRAVENOUS

## 2016-04-25 SURGICAL SUPPLY — 39 items
BINDER ABD UNIV 12 45-62 (WOUND CARE) ×1 IMPLANT
BINDER ABDOMINAL 46IN 62IN (WOUND CARE) ×2
BLADE CLIPPER SURG (BLADE) IMPLANT
CARTRIDGE OIL MAESTRO DRILL (MISCELLANEOUS) ×1 IMPLANT
CHLORAPREP W/TINT 26ML (MISCELLANEOUS) ×2 IMPLANT
DERMABOND ADVANCED (GAUZE/BANDAGES/DRESSINGS) ×1
DERMABOND ADVANCED .7 DNX12 (GAUZE/BANDAGES/DRESSINGS) ×1 IMPLANT
DEVICE IMPLANT NEUROSTIMULATOR (Neuro Prosthesis/Implant) ×2 IMPLANT
DIFFUSER DRILL AIR PNEUMATIC (MISCELLANEOUS) ×2 IMPLANT
DRAPE LAPAROTOMY 100X72X124 (DRAPES) ×2 IMPLANT
DRSG OPSITE POSTOP 4X6 (GAUZE/BANDAGES/DRESSINGS) ×2 IMPLANT
ELECT CAUTERY BLADE 6.4 (BLADE) ×2 IMPLANT
ENVELOPE ABSORB ANTIBACTERIAL (Neuro Prosthesis/Implant) ×2 IMPLANT
GLOVE BIOGEL PI IND STRL 7.5 (GLOVE) ×1 IMPLANT
GLOVE BIOGEL PI INDICATOR 7.5 (GLOVE) ×1
GLOVE ECLIPSE 7.5 STRL STRAW (GLOVE) ×2 IMPLANT
GLOVE EXAM NITRILE LRG STRL (GLOVE) IMPLANT
GLOVE EXAM NITRILE XL STR (GLOVE) IMPLANT
GLOVE EXAM NITRILE XS STR PU (GLOVE) IMPLANT
GOWN STRL REUS W/ TWL LRG LVL3 (GOWN DISPOSABLE) IMPLANT
GOWN STRL REUS W/TWL LRG LVL3 (GOWN DISPOSABLE)
INTELLIS CONTROLLER ×2 IMPLANT
INTELLIS RECHARGER ×2 IMPLANT
KIT BASIN OR (CUSTOM PROCEDURE TRAY) ×2 IMPLANT
KIT ROOM TURNOVER OR (KITS) ×2 IMPLANT
NEEDLE HYPO 25X1 1.5 SAFETY (NEEDLE) ×2 IMPLANT
OIL CARTRIDGE MAESTRO DRILL (MISCELLANEOUS) ×2
PACK LAMINECTOMY NEURO (CUSTOM PROCEDURE TRAY) ×2 IMPLANT
PAD ARMBOARD 7.5X6 YLW CONV (MISCELLANEOUS) ×2 IMPLANT
PENCIL BUTTON HOLSTER BLD 10FT (ELECTRODE) ×2 IMPLANT
SPONGE LAP 4X18 X RAY DECT (DISPOSABLE) IMPLANT
SUT MNCRL AB 4-0 PS2 18 (SUTURE) ×2 IMPLANT
SUT SILK 2 0 FS (SUTURE) IMPLANT
SUT VIC AB 2-0 CP2 18 (SUTURE) ×4 IMPLANT
SYR EPIDURAL 5ML GLASS (SYRINGE) IMPLANT
SYRINGE 10CC LL (SYRINGE) IMPLANT
TOWEL OR 17X24 6PK STRL BLUE (TOWEL DISPOSABLE) ×2 IMPLANT
TOWEL OR 17X26 10 PK STRL BLUE (TOWEL DISPOSABLE) ×2 IMPLANT
WATER STERILE IRR 1000ML POUR (IV SOLUTION) ×2 IMPLANT

## 2016-04-25 NOTE — Discharge Instructions (Signed)
Dr. Alliah Boulanger Post-Op Orders ° °• Ice Pack - 20 minutes on (in a pillow case), and 20 minutes off. Wear the ice pack UNDER the binder. °• Follow up in office, they will call you for an appointment in 10 days to 2 weeks. °• Increase activity gradually.   °• No lifting anything heavier than a gallon of milk (10 pounds) until seen in the office. °• Advance diet slowly as tolerated. °• Dressing care:  Keep dressing dry for 3 days, and on Post-op day 4, may shower. °• Call for fever, drainage, and redness. °• No swimming or bathing in a bathtub (do not get into standing water). °•  °

## 2016-04-25 NOTE — Anesthesia Postprocedure Evaluation (Signed)
Anesthesia Post Note  Patient: Coralie Carpen  Procedure(s) Performed: Procedure(s) (LRB): SPINAL CORD STIMULATOR IMPLANTABLE PULSE GENERATOR REPLACEMENT (N/A)  Patient location during evaluation: PACU Anesthesia Type: General Level of consciousness: awake and alert Pain management: pain level controlled Vital Signs Assessment: post-procedure vital signs reviewed and stable Respiratory status: spontaneous breathing, nonlabored ventilation, respiratory function stable and patient connected to nasal cannula oxygen Cardiovascular status: blood pressure returned to baseline and stable Postop Assessment: no signs of nausea or vomiting Anesthetic complications: no    Last Vitals:  Vitals:   04/25/16 0913 04/25/16 0916  BP: 136/75   Pulse: 91 95  Resp: 14 14  Temp:  (!) 36 C    Last Pain:  Vitals:   04/25/16 0900  PainSc: 7                  Joliet Mallozzi DAVID

## 2016-04-25 NOTE — Op Note (Signed)
PREOP DX: 1) lumbar post-laminectomy syndrome  2) chronic pain syndrome 3) IPG failure/end of lifetime POSTOP DX: same as preop PROCEDURES PERFORMED: 2) IPG replacement SURGEON:Leverne Amrhein  ASSISTANT: NONE  ANESTHESIA: GETA EBL: <25cc  DESCRIPTION OF PROCEDURE: After a discussion of risks, benefits and alternatives, informed consent was obtained. The patient was taken to the OR, general anesthesia induced by the anesthesia team without difficulty, turned prone onto a Jackson table, all pressure points padded, SCD's placed. A timeout was taken to verify the correct patient, position, personnel, availability of appropriate equipment, and administration of perioperative antibiotics.   The lumbar area over his IPG was widely prepped with chloraprep and draped into a sterile field. The skin and subcutaneous tissues around the patient's previous pocket incision was infiltrated with 0.25% bupivicaine 1:200K epinephrine. The subcutaneous pocket was incised with a 10 blade and using sharp, careful dissection the pocket opened and the IPG delivered onto the field. The pocket was inspected for hemostasis, which was found to be excellent. The lead was removed from the IPG, and placed into a Medtronic Intellis IPG,  Serial number [id number]. Impedances were checked and found to be excellent. 2--0 silk sutures were placed in the posterior fascia, and used to fix the IPG in position. Care was taken to coil all lead wiring behind the IPG and not incorporated into the wiring. The IPG was placed into a antibiotic-eluting mesh bag prior to suturing the IPG into position.   The  Incision was copiously irrigated with bacitracin-containing irrigation.The pocket incision was closed with a deeper layer of 2-0 vicryl interrupted sutures, and the skin closed with a running 2-0 subcuticular vicryl suture and dermabond. Sterile dressings were applied. Needle, sponge, and instrument counts were correct x2 at the end of the  case.   The patient was then carefully awakened from anesthesia, turned supine, an abdominal binder placed, and the patient taken to the recovery room. COMPLICATIONS: NONE  CONDITION: Stable throughout the course of the procedure and immediately afterward  DISPOSITION: discharge to home. Discussed care with the patient, and spouse Followup in clinic will be scheduled in 10-14 days.

## 2016-04-25 NOTE — Anesthesia Procedure Notes (Signed)
Procedure Name: Intubation Date/Time: 04/25/2016 7:41 AM Performed by: Lance Coon Pre-anesthesia Checklist: Patient identified, Emergency Drugs available, Suction available, Patient being monitored and Timeout performed Patient Re-evaluated:Patient Re-evaluated prior to inductionOxygen Delivery Method: Circle system utilized Preoxygenation: Pre-oxygenation with 100% oxygen Intubation Type: IV induction Ventilation: Mask ventilation without difficulty Laryngoscope Size: Miller and 3 Grade View: Grade II Tube type: Oral Tube size: 7.5 mm Number of attempts: 1 Airway Equipment and Method: Stylet Placement Confirmation: ETT inserted through vocal cords under direct vision,  positive ETCO2 and breath sounds checked- equal and bilateral Secured at: 23 cm Tube secured with: Tape Dental Injury: Teeth and Oropharynx as per pre-operative assessment

## 2016-04-25 NOTE — Transfer of Care (Signed)
Immediate Anesthesia Transfer of Care Note  Patient: [name]  Procedure(s) Performed: Procedure(s): SPINAL CORD STIMULATOR IMPLANTABLE PULSE GENERATOR REPLACEMENT (N/A)  Patient Location: PACU  Anesthesia Type:General  Level of Consciousness: awake, alert , oriented and patient cooperative  Airway & Oxygen Therapy: Patient Spontanous Breathing and Patient connected to face mask oxygen  Post-op Assessment: Report given to RN and Post -op Vital signs reviewed and stable  Post vital signs: Reviewed and stable  Last Vitals:  Vitals:   04/25/16 0616 04/25/16 0841  BP: 132/70   Pulse: 70   Resp: 18   Temp: 36.6 C (!) (P) 36 C    Last Pain: There were no vitals filed for this visit.    Patients Stated Pain Goal: 3 ([id number])  Complications: No apparent anesthesia complications

## 2016-05-02 ENCOUNTER — Encounter (HOSPITAL_COMMUNITY): Payer: Self-pay | Admitting: Anesthesiology

## 2016-11-11 IMAGING — CT CT ABD-PELV W/O CM
2 of 4 series · 16 of 46 positions shown, 18 images · non-contrast
Comparison: Report of prior CT abdomen and pelvis November 26, 2012
available; images from that study cannot be retrieved.

CLINICAL DATA: Left-sided abdominal pain

EXAM:
CT ABDOMEN AND PELVIS WITHOUT CONTRAST
TECHNIQUE: Multidetector CT imaging of the abdomen and pelvis was performed
following the standard protocol without IV contrast. Oral contrast
was administered.

[Series 2: rtn a/p w/o · axial · non-contrast · 0.91mm/px · z∈[-486,-66]mm · 13 of 92 slices shown, 15 images]
[im 4/92  soft-tissue]
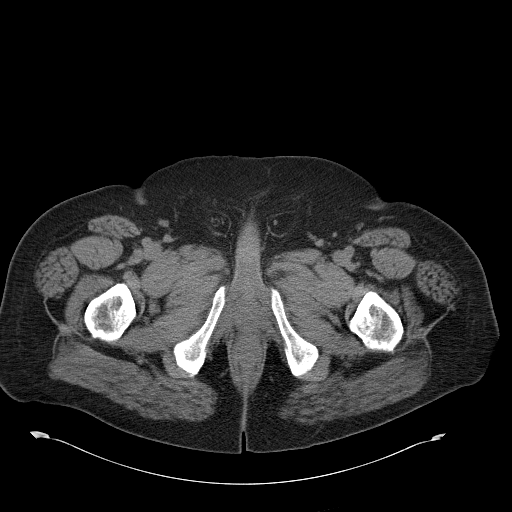
[im 4/92  bone]
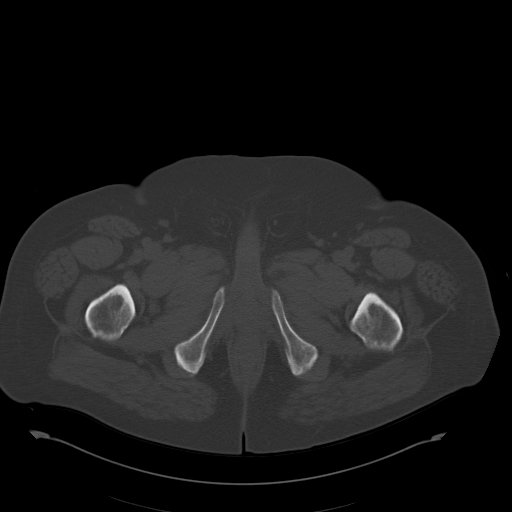
[im 12/92  soft-tissue]
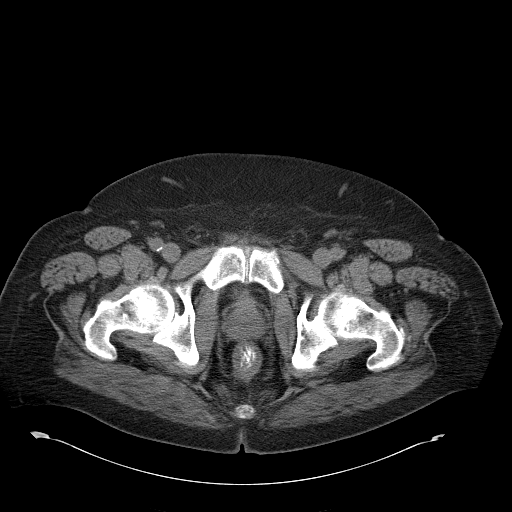
[im 19/92  soft-tissue]
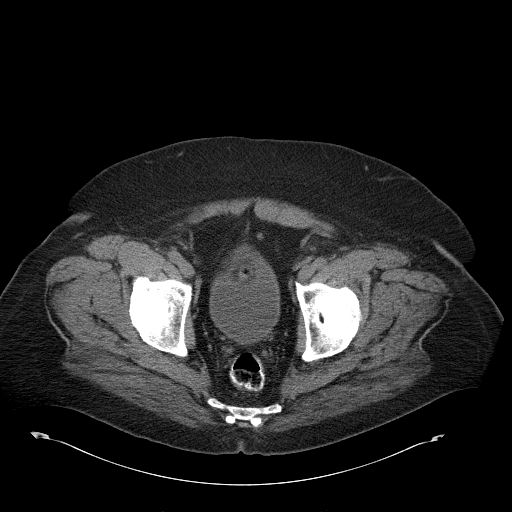
[im 27/92  soft-tissue]
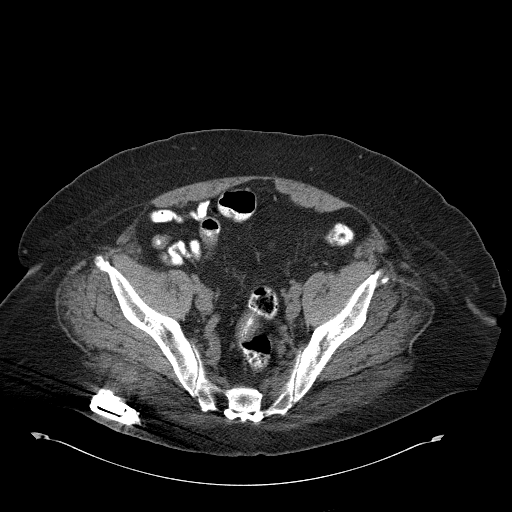
[im 31/92  soft-tissue]
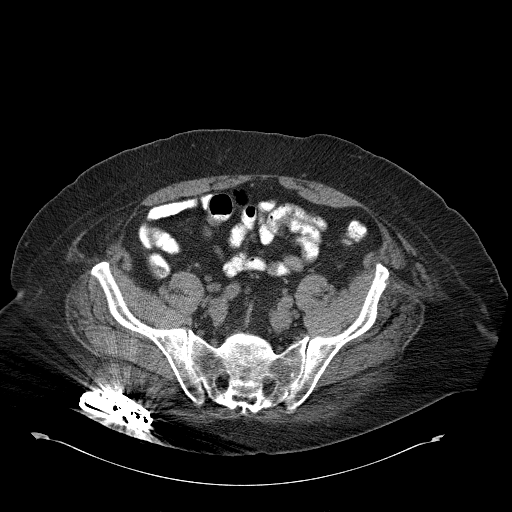
[im 38/92  soft-tissue]
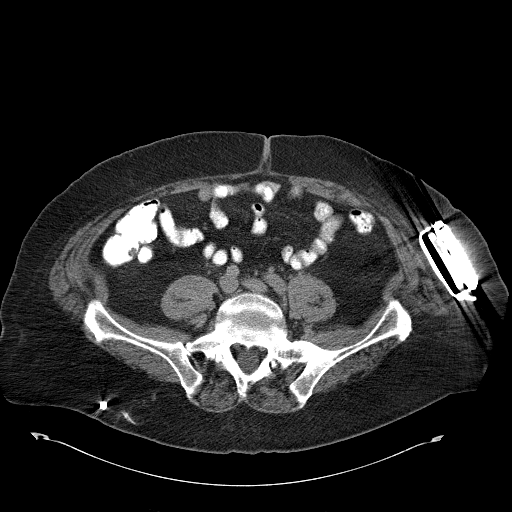
[im 46/92  soft-tissue]
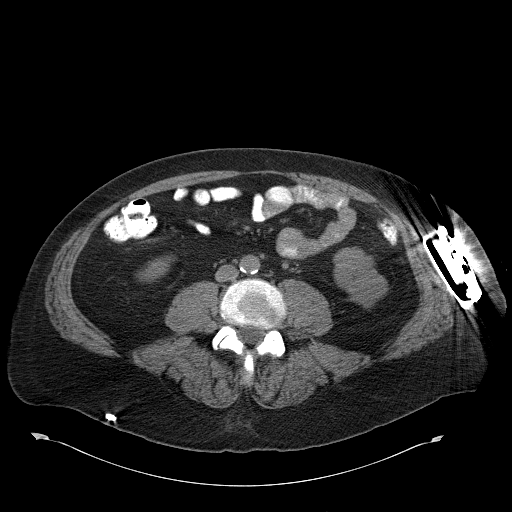
[im 54/92  soft-tissue]
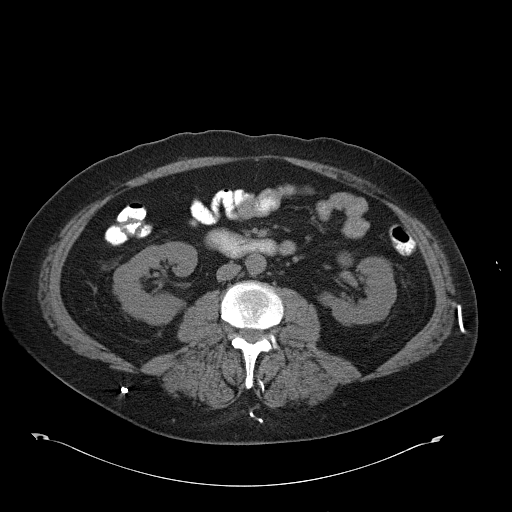
[im 61/92  soft-tissue]
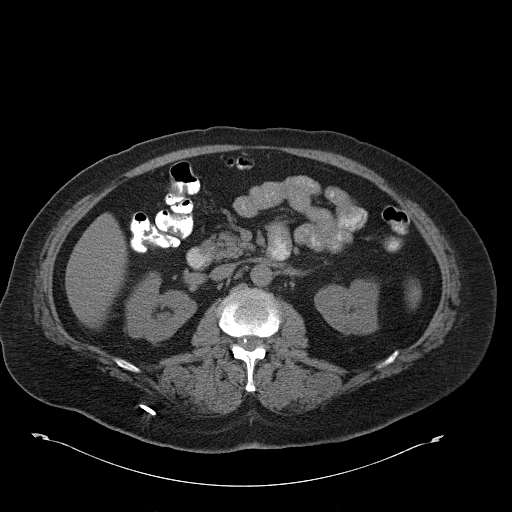
[im 61/92  bone]
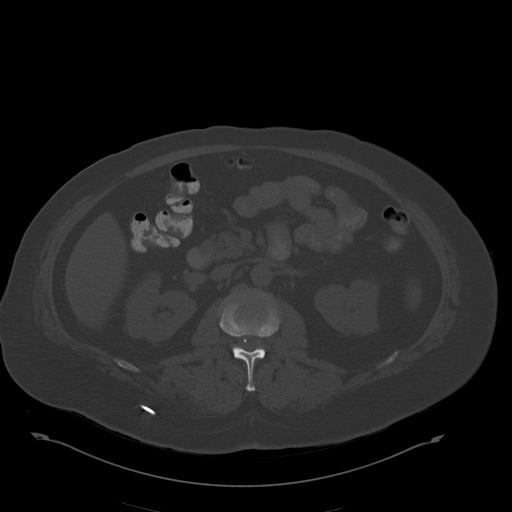
[im 65/92  soft-tissue]
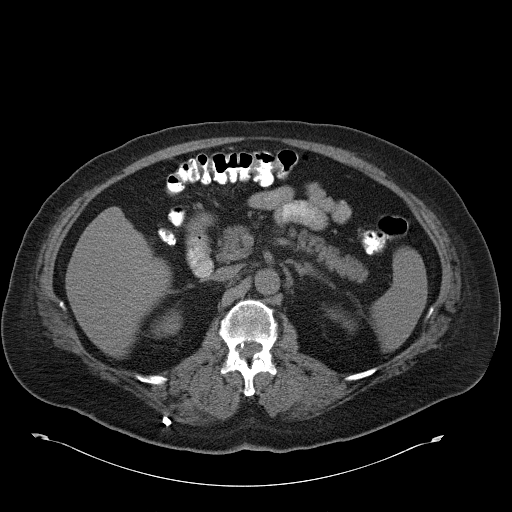
[im 73/92  soft-tissue]
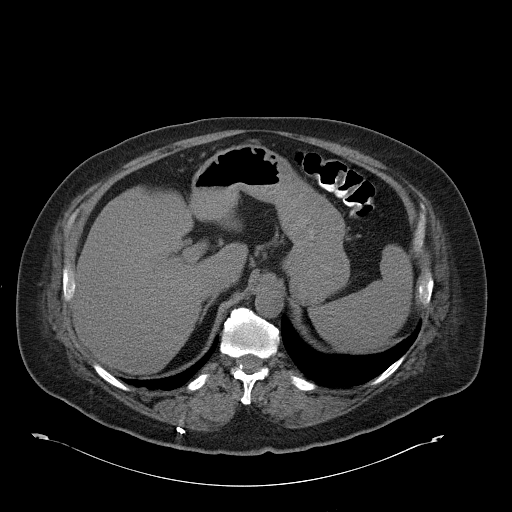
[im 80/92  soft-tissue]
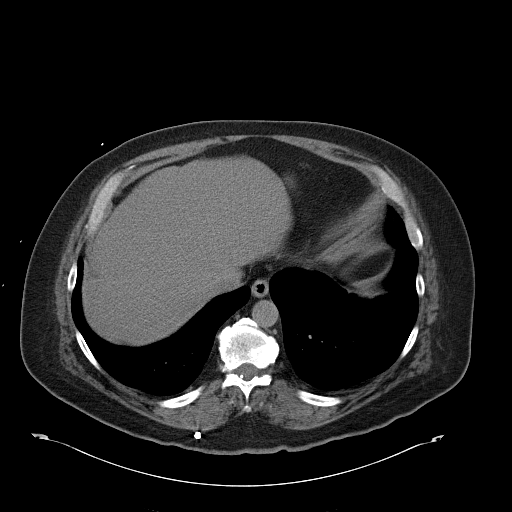
[im 88/92  soft-tissue]
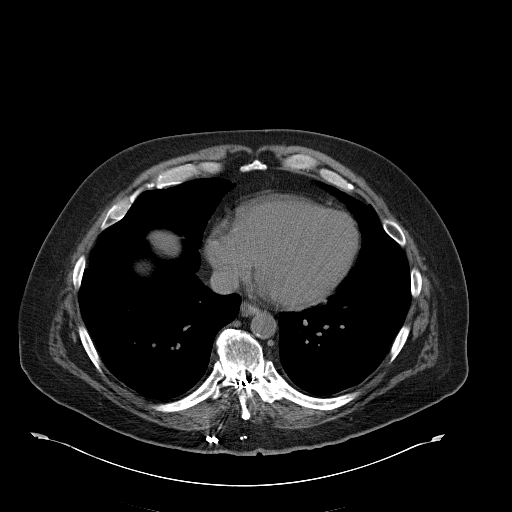

[Series 602: <mpr thick range> · coronal · 0.91mm/px · 3 of 100 slices shown]
[im 34/100  soft-tissue]
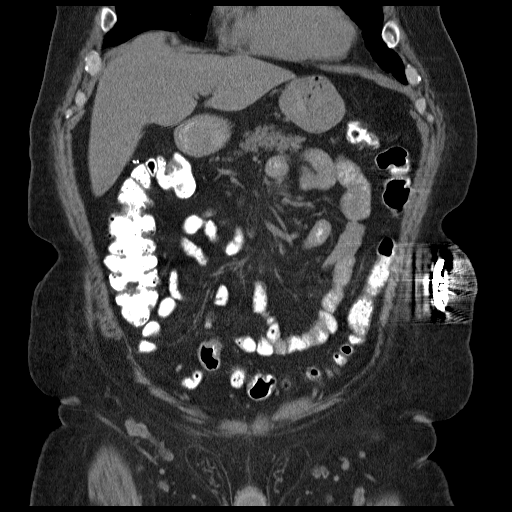
[im 45/100  soft-tissue]
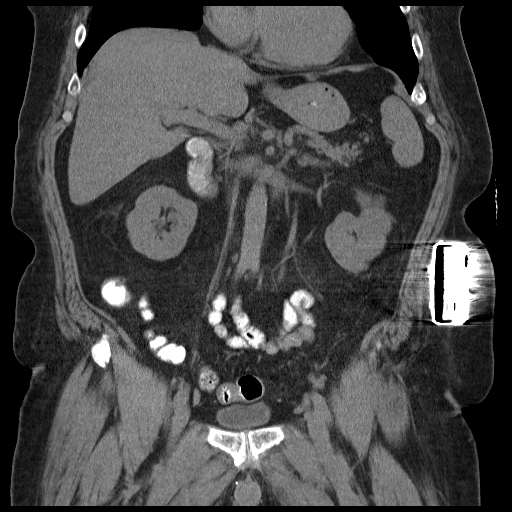
[im 56/100  soft-tissue]
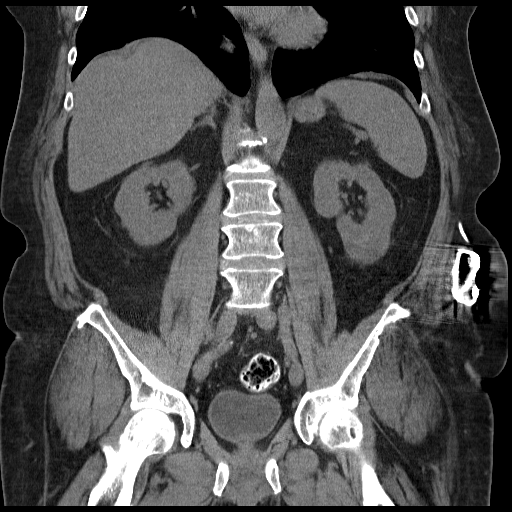

[16 of 46 positions shown; findings below may reference images not displayed]

FINDINGS: Lung bases are clear.

No focal liver lesions are identified on this noncontrast enhanced
study. Gallbladder is absent. There is no appreciable biliary duct
dilatation.

Spleen and pancreas appear normal.

The right adrenal appears within normal limits. There is a 1.5 x
cm apparent adenoma in the left adrenal.

Kidneys bilaterally show no mass or hydronephrosis on either side.
There is fetal lobulation bilaterally common anatomic variant. There
is no renal or ureteral calculus on either side.

In the pelvis, there is no mass or fluid collection. Urinary bladder
is midline with normal wall thickness. Appendix is absent.

Stimulators are noted in the lateral left abdomen and posterior to
the gluteal muscle on the right. Lead from the left-sided device
extends into the thoracic spine. The right-sided stimulator leads
terminate in the soft tissues posterior to the thoracic spine.

There is no bowel obstruction. No free air or portal venous air.
There is no demonstrable ascites, adenopathy, or abscess in the
abdomen or pelvis. There is atherosclerotic change in the aorta but
no aneurysm. There are no blastic or lytic bone lesions. There is a
presumed postoperative defect along the lateral left iliac crest.
There is degenerative change in the lumbar spine.
IMPRESSION: There is no bowel obstruction.  No abscess.  Appendix is absent.

There is no bowel wall or mesenteric thickening. No evidence of
diverticulitis.

Gallbladder and appendix absent.

There are stimulator device is with leads as described. Note that on
the right, the stimulator leads are located posterior to the
thoracic spine and terminate in the soft tissues posterior to the
paraspinous muscles slightly to the right of midline.

Small left adrenal adenoma. Prior report describes small right
adrenal adenoma. Currently no right adrenal mass is appreciable.

## 2017-01-09 ENCOUNTER — Encounter: Payer: Self-pay | Admitting: Surgery

## 2017-01-09 ENCOUNTER — Encounter (HOSPITAL_COMMUNITY): Payer: Medicare Other

## 2017-01-09 ENCOUNTER — Ambulatory Visit: Payer: Medicare Other | Admitting: Family

## 2017-01-12 ENCOUNTER — Encounter: Payer: Self-pay | Admitting: Family

## 2017-01-12 ENCOUNTER — Ambulatory Visit (INDEPENDENT_AMBULATORY_CARE_PROVIDER_SITE_OTHER): Payer: Medicare Other | Admitting: Family

## 2017-01-12 ENCOUNTER — Ambulatory Visit (HOSPITAL_COMMUNITY)
Admission: RE | Admit: 2017-01-12 | Discharge: 2017-01-12 | Disposition: A | Discharge status: Home / Self Care | Payer: Medicare Other | Source: Ambulatory Visit | Attending: Vascular Surgery | Admitting: Vascular Surgery

## 2017-01-12 VITALS — BP 142/91 | HR 73 | Temp 98.1°F | Resp 20 | Ht 70.5 in | Wt 250.0 lb

## 2017-01-12 DIAGNOSIS — I6522 Occlusion and stenosis of left carotid artery: Secondary | ICD-10-CM | POA: Diagnosis not present

## 2017-01-12 DIAGNOSIS — I6523 Occlusion and stenosis of bilateral carotid arteries: Secondary | ICD-10-CM | POA: Insufficient documentation

## 2017-01-12 LAB — VAS US CAROTID
LCCADSYS: -65 cm/s
LCCAPDIAS: 23 cm/s
LEFT ECA DIAS: -16 cm/s
LICADDIAS: -21 cm/s
LICADSYS: -67 cm/s
LICAPDIAS: -17 cm/s
LICAPSYS: -47 cm/s
Left CCA dist dias: -19 cm/s
Left CCA prox sys: 124 cm/s
RCCAPSYS: 128 cm/s
RIGHT CCA MID DIAS: 33 cm/s
RIGHT ECA DIAS: -14 cm/s
Right CCA prox dias: 28 cm/s
Right cca dist sys: -97 cm/s

## 2017-01-12 NOTE — Progress Notes (Signed)
Chief Complaint: Follow up Extracranial Carotid Artery Stenosis   History of Present Illness  [name] is a 61 y.o. male whom Dr. Bridgett Larsson evaluated on 01-05-15,  Referred by Dr. Alma Friendly, his PCP. His chief complaint was "swimmy head".  Patient previous has had a spinal stimulator placed that has resulted in these sx. Previous carotid studies demonstrated: RICA <56% stenosis, LICA >21% stenosis.  Patient has no history of TIA or stroke symptom.  The patient has never had amaurosis fugax or monocular blindness.  The patient has never had unilateral facial drooping or hemiplegia.  The patient has never had receptive or expressive aphasia.   The patient is having R hemifacial numbness and paraesthesias that the neurologist felt might be due to carotid disease.  The patient's risks factors for carotid disease include: HTN, DM, HLD, and active smoking.  At the 01-05-15 visit Dr. Bridgett Larsson noted B ICA stenosis <50%. Dr. Bridgett Larsson was not certain if this patient sx were TIA, but regardless, NASCET would suggest no benefit to CEA on either side given <50% disease bilaterally.  There was minimal plaque in either internal carotid artery along with ICA/CCA ratio well below the threshold of hemodynamically significant disease. Based on the patient's vascular studies and examination, Dr. Bridgett Larsson offered the patient: q2 year B carotid duplex.  Pt states he has had an evaluation by a neurologist.   He had a knee injury about 2009, had several surgeries to address this, and has been on disability for his back and left knee for several years.   He states his PCP is treating him currently for an ear infection, and states he was told that he may have some congestion in his lungs. He denies dyspnea, denies any more cough than usual.    He denies any known history of stroke or TIA. Specifically he denies a history of amaurosis fugax or monocular blindness, unilateral facial drooping, hemiplegia, or receptive or  expressive aphasia.    Pt Diabetic: yes, 6.0 on 04-23-16, pt states diet controlled Pt smoker: smoker  (1/2 ppd, started about age 77 yrs)  Pt meds include: Statin : yes ASA: yes Other anticoagulants/antiplatelets: no   Past Medical History:  Diagnosis Date  . Anemia    per patient  . Arthritis    knees and back  . Chronic back pain   . Depression   . Diabetes mellitus    control with diet/does not check cbgs at home  . Dizziness of unknown cause    when accessing the pump to refill for about week after  . GERD (gastroesophageal reflux disease)   . Headache    occassional migraines  . History of Bell's palsy   . History of bronchitis   . Hypertension   . Numbness and tingling of both legs   . Sleep apnea    wears CPAP    Social History Social History  Substance Use Topics  . Smoking status: Current Every Day Smoker    Packs/day: 0.50    Types: Cigarettes  . Smokeless tobacco: Never Used     Comment: 1/2 pk per day  . Alcohol use No    Family History Family History  Problem Relation Age of Onset  . Atrial fibrillation Mother   . Hypertension Mother   . Diabetes Father   . Hyperlipidemia Father   . Diabetes Brother   . Hypertension Brother     Surgical History Past Surgical History:  Procedure Laterality Date  . APPENDECTOMY    .  CHOLECYSTECTOMY    . COLONOSCOPY    . HAMMER TOE SURGERY Bilateral   . heel spurs     bilateral  . JOINT REPLACEMENT     bilateral knees  . KNEE ARTHROSCOPY Bilateral    "multiple knee surgeries"   . MUSCLE FLAP CLOSURE     skin grafting for muscle shifting  . PAIN PUMP IMPLANTATION    . PAIN PUMP REVISION N/A 12/28/2012   Procedure: intrathecal Baclofen pump exploration with revision of catheter;  Surgeon: [name], MD;  Location: [facility];  Service: Neurosurgery;  Laterality: N/A;  Baclofen pump exploration with [name] to assist  . PAIN PUMP REVISION N/A 04/06/2015   Procedure: IT Pump Exploration,  possible revision, possible removal;  Surgeon: Clydell Hakim, MD;  Location: [facility];  Service: Neurosurgery;  Laterality: N/A;  IT Pump Exploration, possible revision, possible removal  . SPINAL CORD STIMULATOR BATTERY EXCHANGE N/A 04/25/2016   Procedure: SPINAL CORD STIMULATOR IMPLANTABLE PULSE GENERATOR REPLACEMENT;  Surgeon: Clydell Hakim, MD;  Location: [facility];  Service: Neurosurgery;  Laterality: N/A;  . SPINAL CORD STIMULATOR INSERTION  12/23/2011   Procedure: LUMBAR SPINAL CORD STIMULATOR INSERTION;  Surgeon: [name], MD;  Location: [facility];  Service: Neurosurgery;  Laterality: N/A;  Implantable pulse generator change and pump revision  . TONSILLECTOMY    . UVULOPALATOPHARYNGOPLASTY    . WRIST ARTHROPLASTY     right = repair cartiledge    Allergies  Allergen Reactions  . Norvasc [Amlodipine Besylate] Swelling    SWELLING REACTION UNSPECIFIED     Current Outpatient Prescriptions  Medication Sig Dispense Refill  . aspirin EC 81 MG tablet Take 81 mg by mouth daily.    [name] aspirin-acetaminophen-caffeine (EXCEDRIN MIGRAINE) 250-250-65 MG tablet Take 2 tablets by mouth 2 (two) times daily as needed for headache.    [name] atenolol (TENORMIN) 25 MG tablet Take by mouth daily.    [name] atorvastatin (LIPITOR) 10 MG tablet Take 10 mg by mouth at bedtime.     . B Complex-C (SUPER B COMPLEX PO) Take 1 capsule by mouth daily.    . cetirizine (ZYRTEC) 10 MG tablet Take 10 mg by mouth daily as needed for allergies.    . fluticasone (FLONASE) 50 MCG/ACT nasal spray Place 2 sprays into both nostrils daily as needed for allergies.     . Ginger, Zingiber officinalis, (GINGER ROOT) 550 MG CAPS Take 550 mg by mouth at bedtime.    . hydrochlorothiazide (HYDRODIURIL) 25 MG tablet Take 25 mg by mouth once daily    . HYDROmorphone (DILAUDID) 8 MG tablet Take 8 mg by mouth every 4 (four) hours as needed for severe pain.    [name] lisinopril (PRINIVIL,ZESTRIL) 40 MG tablet Take 40 mg by mouth once daily    .  omeprazole (PRILOSEC) 40 MG capsule Take 40 mg by mouth 2 (two) times daily.    . Potassium 99 MG TABS Take 99 mg by mouth daily.     No current facility-administered medications for this visit.     Review of Systems : See HPI for pertinent positives and negatives.  Physical Examination  Vitals:   01/12/17 1048  BP: 137/89  Pulse: 73  Resp: 20  Temp: 98.1 F (36.7 C)  TempSrc: Oral  SpO2: 94%  Weight: 250 lb (113.4 kg)  Height: 5' 10.5" (1.791 m)   Body mass index is 35.36 kg/m.    General: A&O x 3, WD, Obese,   Head: Wake/AT  Eyes: PERRLA  Neck: Supple, no nuchal rigidity, no palpable LAD  Pulmonary: Sym exp, limited air movt, + rales and rhonchi in all posterior fields, no wheezing  Cardiac: RRR, Nl S1, S2, no appreciable murmur  Vascular: Vessel Right Left  Radial Palpable Palpable  Carotid Palpable, without bruit Palpable, without bruit  Aorta  Not palpable N/A  Femoral Palpable Palpable  Popliteal Not palpable Not palpable  PT Palpable Palpable  DP Palpable Palpable   Gastrointestinal: soft, NTND, -G/R, - HSM, - palpable masses, - CVAT B  Musculoskeletal: M/S 5/5 throughout , Extremities without ischemic changes , B knee incisions healed, L calf defect with obvious rotated muscle flap  Neurologic: CN 2-12 intact , Pain and light touch intact in extremities , Motor exam as listed above  Psychiatric: Judgment intact, Mood & affect appropriate for pt's clinical situation  Dermatologic: See M/S exam for extremity exam, no rashes otherwise noted     Assessment: [name] is a 61 y.o. male who has no history of stroke or TIA. The right side forehead tingling has been evaluated by a neurologist.  DATA Carotid Duplex (01/12/17): Right ICA: no stenosis. Left ICA: 1-39% stenosis. Bilateral vertebral artery flow is antegrade.  Bilateral subclavian artery waveforms are normal.  Velocities have decreased bilaterally compared to the exam  on 01-05-15.    Plan:  The patient was counseled re smoking cessation and given several free resources re smoking cessation.   Follow-up in 2 years with Carotid Duplex scan.    I discussed in depth with the patient the nature of atherosclerosis, and emphasized the importance of maximal medical management including strict control of blood pressure, blood glucose, and lipid levels, obtaining regular exercise, and cessation of smoking.  The patient is aware that without maximal medical management the underlying atherosclerotic disease process will progress, limiting the benefit of any interventions. The patient was given information about stroke prevention and what symptoms should prompt the patient to seek immediate medical care. Thank you for allowing [place] to participate in this patient's care.  Clemon Chambers, RN, MSN, FNP-C [facility] Office: [phone]  Clinic Physician: Trula Slade  01/12/17 10:51 AM

## 2017-01-12 NOTE — Patient Instructions (Signed)
Stroke Prevention Some medical conditions and behaviors are associated with an increased chance of having a stroke. You may prevent a stroke by making healthy choices and managing medical conditions. How can I reduce my risk of having a stroke?  Stay physically active. Get at least 30 minutes of activity on most or all days.  Do not smoke. It may also be helpful to avoid exposure to secondhand smoke.  Limit alcohol use. Moderate alcohol use is considered to be: ? No more than 2 drinks per day for men. ? No more than 1 drink per day for nonpregnant women.  Eat healthy foods. This involves: ? Eating 5 or more servings of fruits and vegetables a day. ? Making dietary changes that address high blood pressure (hypertension), high cholesterol, diabetes, or obesity.  Manage your cholesterol levels. ? Making food choices that are high in fiber and low in saturated fat, trans fat, and cholesterol may control cholesterol levels. ? Take any prescribed medicines to control cholesterol as directed by your health care provider.  Manage your diabetes. ? Controlling your carbohydrate and sugar intake is recommended to manage diabetes. ? Take any prescribed medicines to control diabetes as directed by your health care provider.  Control your hypertension. ? Making food choices that are low in salt (sodium), saturated fat, trans fat, and cholesterol is recommended to manage hypertension. ? Ask your health care provider if you need treatment to lower your blood pressure. Take any prescribed medicines to control hypertension as directed by your health care provider. ? If you are 18-39 years of age, have your blood pressure checked every 3-5 years. If you are 40 years of age or older, have your blood pressure checked every year.  Maintain a healthy weight. ? Reducing calorie intake and making food choices that are low in sodium, saturated fat, trans fat, and cholesterol are recommended to manage  weight.  Stop drug abuse.  Avoid taking birth control pills. ? Talk to your health care provider about the risks of taking birth control pills if you are over 35 years old, smoke, get migraines, or have ever had a blood clot.  Get evaluated for sleep disorders (sleep apnea). ? Talk to your health care provider about getting a sleep evaluation if you snore a lot or have excessive sleepiness.  Take medicines only as directed by your health care provider. ? For some people, aspirin or blood thinners (anticoagulants) are helpful in reducing the risk of forming abnormal blood clots that can lead to stroke. If you have the irregular heart rhythm of atrial fibrillation, you should be on a blood thinner unless there is a good reason you cannot take them. ? Understand all your medicine instructions.  Make sure that other conditions (such as anemia or atherosclerosis) are addressed. Get help right away if:  You have sudden weakness or numbness of the face, arm, or leg, especially on one side of the body.  Your face or eyelid droops to one side.  You have sudden confusion.  You have trouble speaking (aphasia) or understanding.  You have sudden trouble seeing in one or both eyes.  You have sudden trouble walking.  You have dizziness.  You have a loss of balance or coordination.  You have a sudden, severe headache with no known cause.  You have new chest pain or an irregular heartbeat. Any of these symptoms may represent a serious problem that is an emergency. Do not wait to see if the symptoms will go away.   Get medical help at once. Call your local emergency services (911 in [place] Do not drive yourself to the hospital. This information is not intended to replace advice given to you by your health care provider. Make sure you discuss any questions you have with your health care provider. Document Released: 06/12/2004 Document Revised: 10/11/2015 Document Reviewed: 11/05/2012 Elsevier  Interactive Patient Education  2017 Elsevier Inc.     Steps to Quit Smoking Smoking tobacco can be bad for your health. It can also affect almost every organ in your body. Smoking puts you and people around you at risk for many serious long-lasting (chronic) diseases. Quitting smoking is hard, but it is one of the best things that you can do for your health. It is never too late to quit. What are the benefits of quitting smoking? When you quit smoking, you lower your risk for getting serious diseases and conditions. They can include:  Lung cancer or lung disease.  Heart disease.  Stroke.  Heart attack.  Not being able to have children (infertility).  Weak bones (osteoporosis) and broken bones (fractures).  If you have coughing, wheezing, and shortness of breath, those symptoms may get better when you quit. You may also get sick less often. If you are pregnant, quitting smoking can help to lower your chances of having a baby of low birth weight. What can I do to help me quit smoking? Talk with your doctor about what can help you quit smoking. Some things you can do (strategies) include:  Quitting smoking totally, instead of slowly cutting back how much you smoke over a period of time.  Going to in-person counseling. You are more likely to quit if you go to many counseling sessions.  Using resources and support systems, such as: ? Online chats with a counselor. ? Phone quitlines. ? Printed self-help materials. ? Support groups or group counseling. ? Text messaging programs. ? Mobile phone apps or applications.  Taking medicines. Some of these medicines may have nicotine in them. If you are pregnant or breastfeeding, do not take any medicines to quit smoking unless your doctor says it is okay. Talk with your doctor about counseling or other things that can help you.  Talk with your doctor about using more than one strategy at the same time, such as taking medicines while you are  also going to in-person counseling. This can help make quitting easier. What things can I do to make it easier to quit? Quitting smoking might feel very hard at first, but there is a lot that you can do to make it easier. Take these steps:  Talk to your family and friends. Ask them to support and encourage you.  Call phone quitlines, reach out to support groups, or work with a counselor.  Ask people who smoke to not smoke around you.  Avoid places that make you want (trigger) to smoke, such as: ? Bars. ? Parties. ? Smoke-break areas at work.  Spend time with people who do not smoke.  Lower the stress in your life. Stress can make you want to smoke. Try these things to help your stress: ? Getting regular exercise. ? Deep-breathing exercises. ? Yoga. ? Meditating. ? Doing a body scan. To do this, close your eyes, focus on one area of your body at a time from head to toe, and notice which parts of your body are tense. Try to relax the muscles in those areas.  Download or buy apps on your mobile phone or   tablet that can help you stick to your quit plan. There are many free apps, such as QuitGuide from the CDC (Centers for Disease Control and Prevention). You can find more support from smokefree.gov and other websites.  This information is not intended to replace advice given to you by your health care provider. Make sure you discuss any questions you have with your health care provider. Document Released: 03/01/2009 Document Revised: 01/01/2016 Document Reviewed: 09/19/2014 Elsevier Interactive Patient Education  2018 Elsevier Inc.  

## 2017-01-16 ENCOUNTER — Ambulatory Visit: Payer: Medicare Other | Admitting: Family

## 2017-01-16 ENCOUNTER — Encounter (HOSPITAL_COMMUNITY): Payer: Medicare Other

## 2017-01-21 NOTE — Addendum Note (Signed)
Addended by: Lianne Cure A on: 01/21/2017 11:09 AM   Modules accepted: Orders

## 2017-02-02 ENCOUNTER — Other Ambulatory Visit: Payer: Self-pay | Admitting: Anesthesiology

## 2017-02-02 DIAGNOSIS — M5417 Radiculopathy, lumbosacral region: Secondary | ICD-10-CM

## 2017-02-18 ENCOUNTER — Ambulatory Visit
Admission: RE | Admit: 2017-02-18 | Discharge: 2017-02-18 | Disposition: A | Discharge status: Home / Self Care | Payer: Medicare Other | Source: Ambulatory Visit | Attending: Anesthesiology | Admitting: Anesthesiology

## 2017-02-18 DIAGNOSIS — M5417 Radiculopathy, lumbosacral region: Secondary | ICD-10-CM

## 2017-02-18 MED ORDER — DIAZEPAM 5 MG PO TABS
10.0000 mg | ORAL_TABLET | Freq: Once | ORAL | Status: AC
  Administered 2017-02-18: 10 mg via ORAL

## 2017-02-18 MED ORDER — IOPAMIDOL (ISOVUE-M 200) INJECTION 41%
15.0000 mL | Freq: Once | INTRAMUSCULAR | Status: AC
  Administered 2017-02-18: 15 mL via INTRATHECAL

## 2017-02-18 MED ORDER — HYDROMORPHONE HCL 8 MG PO TABS
8.0000 mg | ORAL_TABLET | Freq: Once | ORAL | Status: AC
  Administered 2017-02-18: 8 mg via ORAL

## 2017-02-18 NOTE — Discharge Instructions (Signed)

## 2019-09-08 ENCOUNTER — Other Ambulatory Visit: Payer: Self-pay | Admitting: *Deleted

## 2019-09-08 DIAGNOSIS — I6529 Occlusion and stenosis of unspecified carotid artery: Secondary | ICD-10-CM

## 2019-09-14 ENCOUNTER — Other Ambulatory Visit: Payer: Self-pay

## 2019-09-14 ENCOUNTER — Ambulatory Visit (HOSPITAL_COMMUNITY)
Admission: RE | Admit: 2019-09-14 | Discharge: 2019-09-14 | Disposition: A | Discharge status: Home / Self Care | Payer: Medicare Other | Source: Ambulatory Visit | Attending: Surgery | Admitting: Surgery

## 2019-09-14 ENCOUNTER — Ambulatory Visit: Payer: Medicare Other | Admitting: Physician Assistant

## 2019-09-14 VITALS — BP 102/68 | HR 63 | Resp 18 | Ht 71.0 in | Wt 289.0 lb

## 2019-09-14 DIAGNOSIS — I6529 Occlusion and stenosis of unspecified carotid artery: Secondary | ICD-10-CM

## 2019-09-14 NOTE — Progress Notes (Signed)
Established Carotid Patient   History of Present Illness   [name] is a 64 y.o. (1955-10-13) male who presents to go over vascular studies related to carotid artery stenosis. In 2016 patient had a spinal stimulator placed that resulted in symptoms of right hemifacial numbness and paresthesias that the neurologist felt may be due to carotid artery disease estimated to be about 50 to 70% stenosis. Duplex was repeated in office which demonstrated less than 50% stenosis at that time. He has been followed with carotid duplex every 2 years since that time demonstrating less than 40% stenosis. He denies any strokelike symptoms including slurring speech, changes in vision, or one-sided weakness. He is taking an aspirin and a statin daily. Patient states he occasionally vapes however denies tobacco use.  The patient's PMH, PSH, SH, and FamHx were reviewed and are unchanged from prior visit.  Current Outpatient Medications  Medication Sig Dispense Refill  . aspirin EC 81 MG tablet Take 81 mg by mouth daily.    [name] aspirin-acetaminophen-caffeine (EXCEDRIN MIGRAINE) 250-250-65 MG tablet Take 2 tablets by mouth 2 (two) times daily as needed for headache.    [name] atenolol (TENORMIN) 25 MG tablet Take by mouth daily.    [name] atorvastatin (LIPITOR) 10 MG tablet Take 10 mg by mouth at bedtime.     . B Complex-C (SUPER B COMPLEX PO) Take 1 capsule by mouth daily.    . cetirizine (ZYRTEC) 10 MG tablet Take 10 mg by mouth daily as needed for allergies.    . fluticasone (FLONASE) 50 MCG/ACT nasal spray Place 2 sprays into both nostrils daily as needed for allergies.     . Ginger, Zingiber officinalis, (GINGER ROOT) 550 MG CAPS Take 550 mg by mouth at bedtime.    . hydrochlorothiazide (HYDRODIURIL) 25 MG tablet Take 25 mg by mouth once daily    . HYDROmorphone (DILAUDID) 8 MG tablet Take 8 mg by mouth every 4 (four) hours as needed for severe pain.    [name] lisinopril (PRINIVIL,ZESTRIL) 40 MG tablet Take 40 mg by  mouth once daily    . omeprazole (PRILOSEC) 40 MG capsule Take 40 mg by mouth 2 (two) times daily.    . Potassium 99 MG TABS Take 99 mg by mouth daily.     No current facility-administered medications for this visit.    REVIEW OF SYSTEMS (negative unless checked):   Cardiac:  []  Chest pain or chest pressure? []  Shortness of breath upon activity? []  Shortness of breath when lying flat? []  Irregular heart rhythm?  Vascular:  []  Pain in calf, thigh, or hip brought on by walking? []  Pain in feet at night that wakes you up from your sleep? []  Blood clot in your veins? []  Leg swelling?  Pulmonary:  []  Oxygen at home? []  Productive cough? []  Wheezing?  Neurologic:  []  Sudden weakness in arms or legs? []  Sudden numbness in arms or legs? []  Sudden onset of difficult speaking or slurred speech? []  Temporary loss of vision in one eye? []  Problems with dizziness?  Gastrointestinal:  []  Blood in stool? []  Vomited blood?  Genitourinary:  []  Burning when urinating? []  Blood in urine?  Psychiatric:  []  Major depression  Hematologic:  []  Bleeding problems? []  Problems with blood clotting?  Dermatologic:  []  Rashes or ulcers?  Constitutional:  []  Fever or chills?  Ear/Nose/Throat:  []  Change in hearing? []  Nose bleeds? []  Sore throat?  Musculoskeletal:  []  Back pain? []  Joint pain? []  Muscle pain?  Physical Examination   Vitals:   09/14/19 1236  BP: 102/68  Pulse: 63  Resp: 18  SpO2: 96%  Weight: 289 lb (131.1 kg)  Height: 5\' 11"  (1.803 m)   Body mass index is 40.31 kg/m.  General:  WDWN in NAD; vital signs documented above Gait: Not observed HENT: WNL, normocephalic Pulmonary: normal non-labored breathing , without Rales, rhonchi,  wheezing Cardiac: regular HR Abdomen: soft, NT, no masses Skin: without rashes Vascular Exam/Pulses:  Right Left  Radial 2+ (normal) 2+ (normal)  DP 2+ (normal) 2+ (normal)   Extremities: without ischemic changes,  without Gangrene , without cellulitis; without open wounds;  Musculoskeletal: no muscle wasting or atrophy  Neurologic: A&O X 3;  No focal weakness or paresthesias are detected Psychiatric:  The pt has Normal affect.  Non-Invasive Vascular Imaging   B Carotid Duplex :   R ICA stenosis:  normal  R VA:  patent and antegrade  L ICA stenosis:  1-39%  L VA:  patent and antegrade   Medical Decision Making   [name] is a 64 y.o. male who presents for carotid artery surveillance   Based on carotid duplex, patient has a relatively normal-appearing right ICA and 1 to 39% stenosis of left ICA with velocities consistent with the lower end of range  Over the past several scans we have not been able to replicate left ICA stenosis of 50 to 70% which was documented in 2016  Continue aspirin and statin  Follow regularly with PCP for management of chronic medical conditions including hypertension  We will continue to loosen surveillance of carotid arteries; he can follow up in 5 years for repeat duplex    Dagoberto Ligas PA-C [facility] Office: [phone]  Clinic MD: Scot Dock

## 2024-06-03 ENCOUNTER — Other Ambulatory Visit: Payer: Self-pay | Admitting: Neurosurgery

## 2024-06-03 DIAGNOSIS — M48061 Spinal stenosis, lumbar region without neurogenic claudication: Secondary | ICD-10-CM
